# Patient Record
Sex: Female | Born: 1950
Health system: Southern US, Community
[De-identification: ages and names within clinical notes are randomized; demographics above are authoritative.]

## PROBLEM LIST (undated history)

## (undated) DIAGNOSIS — G4733 Obstructive sleep apnea (adult) (pediatric): Secondary | ICD-10-CM

## (undated) DIAGNOSIS — J449 Chronic obstructive pulmonary disease, unspecified: Secondary | ICD-10-CM

## (undated) HISTORY — PX: ABDOMINAL HYSTERECTOMY: SHX81

## (undated) HISTORY — DX: Chronic obstructive pulmonary disease, unspecified: J44.9

## (undated) HISTORY — PX: BREAST REDUCTION SURGERY: SHX8

## (undated) HISTORY — DX: Obstructive sleep apnea (adult) (pediatric): G47.33

## (undated) HISTORY — PX: REDUCTION MAMMAPLASTY: SUR839

## (undated) HISTORY — PX: BREAST BIOPSY: SHX20

## (undated) HISTORY — PX: OTHER SURGICAL HISTORY: SHX169

---

## 2018-12-22 DIAGNOSIS — I10 Essential (primary) hypertension: Secondary | ICD-10-CM | POA: Insufficient documentation

## 2018-12-22 DIAGNOSIS — I2781 Cor pulmonale (chronic): Secondary | ICD-10-CM | POA: Insufficient documentation

## 2019-02-25 ENCOUNTER — Telehealth: Payer: Self-pay | Admitting: Internal Medicine

## 2019-02-25 NOTE — Telephone Encounter (Signed)
Spoke with patient. She stated that she has an appt with Dr. Maple Hudson tomorrow at 130 for OSA. She had a sleep study done back in [place] and wanted CY to have a copy of it before her exam. She stated that the request needed to come directly from [place].   Advised her that I would send a fax letterhead to [facility] to request the sleep study. She verbalized understanding. Fax has been sent. Nothing further needed at time of call.

## 2019-02-26 ENCOUNTER — Encounter: Payer: Self-pay | Admitting: Internal Medicine

## 2019-02-26 ENCOUNTER — Ambulatory Visit (INDEPENDENT_AMBULATORY_CARE_PROVIDER_SITE_OTHER): Payer: Medicare Other | Admitting: Internal Medicine

## 2019-02-26 VITALS — BP 100/50 | HR 69 | Temp 98.0°F | Ht 60.5 in | Wt 176.8 lb

## 2019-02-26 DIAGNOSIS — J449 Chronic obstructive pulmonary disease, unspecified: Secondary | ICD-10-CM | POA: Diagnosis not present

## 2019-02-26 DIAGNOSIS — G4733 Obstructive sleep apnea (adult) (pediatric): Secondary | ICD-10-CM

## 2019-02-26 NOTE — Assessment & Plan Note (Signed)
We have no documentation. She is now stable and plans to get meds refilled with her new PCP - appointment is pending with no acute needs. Stopped smoking around 1997.

## 2019-02-26 NOTE — Patient Instructions (Signed)
Order- DME [facility]- please service or replace current machine- making disturbing noise. Auto 5-15, mask of choice, humidifier, supplies, AirView  Please call if we can help

## 2019-02-26 NOTE — Progress Notes (Addendum)
02/26/2019-68 year old female former smoker for sleep evaluation. Widowed, lives alone, not working. NPSG 2018- [place]/ [facility] 06/26/17- AHI 108.1/ hr, desaturation to 82%, BMI 40.8 CPAP / [facility] ----self referred; sleep study done 2018 for OSA; on CPAP w/ [facility] Body weight today 176 lbs She has been using CPAP consistently for several years with improved sleep and has had several sleep studies in [place].  She thinks records were being sent to [facility] care, but the local office has no record of her at this time.  Current machine is about 34-1/67 years old, and was through [employer], [employer]/film[employer]video in [place]. Starting a few weeks before she moved, the machine began making a disturbing rattling noise which starts about an hour after she turns it on.  This happens repeatedly through the night and is interfering with sleep so that she turns the machine off.  She says she took it to [facility] and was told the machine would need to be replaced.  She does not know her pressure settings. She has a dx oc COPD, managed medically w/o O2. This is stable and she will have it managed for now through new local PCP with appointment pending.  Denies ENT surgery or heart disease. Mild occasional GERD symptoms. Has been dieting with weight loss over the past year.  Prior to Admission medications   Medication Sig Start Date End Date Taking? Authorizing Provider  aspirin EC 81 MG tablet Take 81 mg by mouth daily.   Yes [provider]  bumetanide (BUMEX) 1 MG tablet Take 1 mg by mouth daily.   Yes [provider]  cholecalciferol (VITAMIN D3) 25 MCG (1000 UT) tablet Take 1,000 Units by mouth daily. Pt states she takes 5,000 mg daily   Yes [provider]  rOPINIRole (REQUIP) 1 MG tablet Take 1 mg by mouth daily.   Yes [provider]   Past Medical History:  Diagnosis Date  . COPD (chronic obstructive pulmonary disease) (HCC)   . OSA (obstructive sleep apnea)    Past Surgical History:   Procedure Laterality Date  . ABDOMINAL HYSTERECTOMY    . BREAST REDUCTION SURGERY    . right knee replacement     Family History  Problem Relation Age of Onset  . Emphysema Mother   . Heart attack Mother   . Heart attack Father   . Multiple sclerosis Father    Social History   Socioeconomic History  . Marital status: Unknown    Spouse name: Not on file  . Number of children: Not on file  . Years of education: Not on file  . Highest education level: Not on file  Occupational History  . Not on file  Social Needs  . Financial resource strain: Not on file  . Food insecurity:    Worry: Not on file    Inability: Not on file  . Transportation needs:    Medical: Not on file    Non-medical: Not on file  Tobacco Use  . Smoking status: Former Smoker    Packs/day: 0.50    Types: Cigarettes    Last attempt to quit: 12/04/1995    Years since quitting: 23.2  . Smokeless tobacco: Never Used  Substance and Sexual Activity  . Alcohol use: Yes    Comment: couple drinks/month  . Drug use: Never  . Sexual activity: Not on file  Lifestyle  . Physical activity:    Days per week: Not on file    Minutes per session: Not on file  . Stress:  Not on file  Relationships  . Social connections:    Talks on phone: Not on file    Gets together: Not on file    Attends religious service: Not on file    Active member of club or organization: Not on file    Attends meetings of clubs or organizations: Not on file    Relationship status: Not on file  . Intimate partner violence:    Fear of current or ex partner: Not on file    Emotionally abused: Not on file    Physically abused: Not on file    Forced sexual activity: Not on file  Other Topics Concern  . Not on file  Social History Narrative  . Not on file   ROS-see HPI   + = positive Constitutional:    +weight loss, night sweats, fevers, chills, fatigue, lassitude. HEENT:    headaches, difficulty swallowing, tooth/dental problems, sore  throat,      + sneezing, itching, ear ache, nasal congestion, post nasal drip, snoring CV:    chest pain, orthopnea, PND, swelling in lower extremities, anasarca,                                  dizziness, palpitations Resp:   +shortness of breath with exertion or at rest.                productive cough,   non-productive cough, coughing up of blood.              change in color of mucus.  wheezing.   Skin:    rash or lesions. GI:  No-   heartburn, indigestion, abdominal pain, nausea, vomiting, diarrhea,                 change in bowel habits, loss of appetite GU: dysuria, change in color of urine, no urgency or frequency.   flank pain. MS:   +joint pain, stiffness, decreased range of motion, back pain. Neuro-     nothing unusual Psych:  change in mood or affect.  depression or anxiety.   memory loss.  OBJ- Physical Exam General- Alert, Oriented, Affect-appropriate, Distress- none acute Skin- rash-none, lesions- none, excoriation- none Lymphadenopathy- none Head- atraumatic            Eyes- Gross vision intact, PERRLA, conjunctivae and secretions clear            Ears- Hearing, canals-normal            Nose- Clear, no-Septal dev, mucus, polyps, erosion, perforation             Throat- Mallampati II , mucosa clear , drainage- none, tonsils- atrophic Neck- flexible , trachea midline, no stridor , thyroid nl, carotid no bruit Chest - symmetrical excursion , unlabored           Heart/CV- RRR , no murmur , no gallop  , no rub, nl s1 s2                           - JVD- none , edema- none, stasis changes- none, varices- none           Lung- clear to P&A, wheeze- none, cough- none , dullness-none, rub- none           Chest wall-  Abd-  Br/ Gen/ Rectal- Not done, not indicated Extrem- cyanosis- none, clubbing, none,  atrophy- none, strength- nl Neuro- grossly intact to observation

## 2019-02-26 NOTE — Assessment & Plan Note (Addendum)
We are requesting supporting documentation from her previous provider in [place] and [address]. She has had several sleep studies. She describes improved quality of sleep and good compliance with CPAP until machine malfunctioned.  If her machine really needs to be replaced, we will ask for auto 5-15 and download. That may require input from local [facility], if her previous communication was with a [place] branch

## 2019-03-02 ENCOUNTER — Telehealth: Payer: Self-pay | Admitting: Internal Medicine

## 2019-03-02 NOTE — Telephone Encounter (Signed)
Call returned to patient, patient wanted to know the status of her records request from her other office. Made aware records have not been received yet. Made patient aware I would give at least a week. Voiced understanding. She would like a call back once we have received these. Will route message to Irving Burton to hold until we receive sleep study. Will update chart once received.

## 2019-03-03 NOTE — Telephone Encounter (Signed)
This was previously routed to [place].

## 2019-03-04 NOTE — Telephone Encounter (Signed)
Check CY's box this morning and saw patient's sleep study from previous pulmonologist had been faxed over. Called patient to inform her of this. Let her know that once CY reviews study we will go from there as far as treatment is concerned. Also let patient know to give [place] a call should she have any questions or concerns. Nothing further needed at this time.

## 2019-03-19 ENCOUNTER — Ambulatory Visit: Payer: Self-pay | Admitting: Family Medicine

## 2019-03-26 ENCOUNTER — Encounter: Payer: Self-pay | Admitting: Family Medicine

## 2019-04-17 DIAGNOSIS — J449 Chronic obstructive pulmonary disease, unspecified: Secondary | ICD-10-CM | POA: Diagnosis not present

## 2019-05-18 DIAGNOSIS — H2513 Age-related nuclear cataract, bilateral: Secondary | ICD-10-CM | POA: Diagnosis not present

## 2019-05-18 DIAGNOSIS — H21233 Degeneration of iris (pigmentary), bilateral: Secondary | ICD-10-CM | POA: Diagnosis not present

## 2019-05-18 DIAGNOSIS — J449 Chronic obstructive pulmonary disease, unspecified: Secondary | ICD-10-CM | POA: Diagnosis not present

## 2019-05-18 DIAGNOSIS — H40013 Open angle with borderline findings, low risk, bilateral: Secondary | ICD-10-CM | POA: Diagnosis not present

## 2019-05-18 DIAGNOSIS — H25013 Cortical age-related cataract, bilateral: Secondary | ICD-10-CM | POA: Diagnosis not present

## 2019-05-21 DIAGNOSIS — M5137 Other intervertebral disc degeneration, lumbosacral region: Secondary | ICD-10-CM | POA: Diagnosis not present

## 2019-05-21 DIAGNOSIS — M9903 Segmental and somatic dysfunction of lumbar region: Secondary | ICD-10-CM | POA: Diagnosis not present

## 2019-05-21 DIAGNOSIS — M25551 Pain in right hip: Secondary | ICD-10-CM | POA: Diagnosis not present

## 2019-05-21 DIAGNOSIS — M9905 Segmental and somatic dysfunction of pelvic region: Secondary | ICD-10-CM | POA: Diagnosis not present

## 2019-05-25 DIAGNOSIS — M25551 Pain in right hip: Secondary | ICD-10-CM | POA: Diagnosis not present

## 2019-05-25 DIAGNOSIS — M9903 Segmental and somatic dysfunction of lumbar region: Secondary | ICD-10-CM | POA: Diagnosis not present

## 2019-05-25 DIAGNOSIS — M5136 Other intervertebral disc degeneration, lumbar region: Secondary | ICD-10-CM | POA: Diagnosis not present

## 2019-05-25 DIAGNOSIS — M5137 Other intervertebral disc degeneration, lumbosacral region: Secondary | ICD-10-CM | POA: Diagnosis not present

## 2019-05-25 DIAGNOSIS — M9905 Segmental and somatic dysfunction of pelvic region: Secondary | ICD-10-CM | POA: Diagnosis not present

## 2019-05-26 DIAGNOSIS — M25551 Pain in right hip: Secondary | ICD-10-CM | POA: Diagnosis not present

## 2019-05-26 DIAGNOSIS — M5137 Other intervertebral disc degeneration, lumbosacral region: Secondary | ICD-10-CM | POA: Diagnosis not present

## 2019-05-26 DIAGNOSIS — M9905 Segmental and somatic dysfunction of pelvic region: Secondary | ICD-10-CM | POA: Diagnosis not present

## 2019-05-26 DIAGNOSIS — M9903 Segmental and somatic dysfunction of lumbar region: Secondary | ICD-10-CM | POA: Diagnosis not present

## 2019-05-28 DIAGNOSIS — M5137 Other intervertebral disc degeneration, lumbosacral region: Secondary | ICD-10-CM | POA: Diagnosis not present

## 2019-05-28 DIAGNOSIS — M9905 Segmental and somatic dysfunction of pelvic region: Secondary | ICD-10-CM | POA: Diagnosis not present

## 2019-05-28 DIAGNOSIS — M9903 Segmental and somatic dysfunction of lumbar region: Secondary | ICD-10-CM | POA: Diagnosis not present

## 2019-05-28 DIAGNOSIS — M25551 Pain in right hip: Secondary | ICD-10-CM | POA: Diagnosis not present

## 2019-05-29 ENCOUNTER — Encounter: Payer: Self-pay | Admitting: Internal Medicine

## 2019-05-29 ENCOUNTER — Ambulatory Visit (INDEPENDENT_AMBULATORY_CARE_PROVIDER_SITE_OTHER): Payer: Medicare Other

## 2019-05-29 ENCOUNTER — Ambulatory Visit: Payer: Medicare Other | Admitting: Internal Medicine

## 2019-05-29 ENCOUNTER — Other Ambulatory Visit: Payer: Self-pay

## 2019-05-29 VITALS — BP 118/80 | HR 74 | Temp 98.5°F | Ht 60.0 in | Wt 183.8 lb

## 2019-05-29 DIAGNOSIS — J449 Chronic obstructive pulmonary disease, unspecified: Secondary | ICD-10-CM

## 2019-05-29 DIAGNOSIS — J811 Chronic pulmonary edema: Secondary | ICD-10-CM | POA: Diagnosis not present

## 2019-05-29 DIAGNOSIS — G4733 Obstructive sleep apnea (adult) (pediatric): Secondary | ICD-10-CM | POA: Diagnosis not present

## 2019-05-29 MED ORDER — ALBUTEROL SULFATE HFA 108 (90 BASE) MCG/ACT IN AERS: 2.0000 | INHALATION_SPRAY | Freq: Four times a day (QID) | RESPIRATORY_TRACT | 12 refills | Status: DC | PRN

## 2019-05-29 NOTE — Progress Notes (Signed)
HPI  female former smoker followed for OSA, complicated by COPD. Widowed, lives alone, not working. NPSG 2018- [place]/ [facility] 06/26/17- AHI 108.1/ hr, desaturation to 82%, BMI 40.8  -------------------------------------------------------------------------- 02/26/2019-68 year old female former smoker for sleep evaluation. Widowed, lives alone, not working. NPSG 2018- [place]/ [facility] 06/26/17- AHI 108.1/ hr, desaturation to 82%, BMI 40.8 CPAP / [facility] ----self referred; sleep study done 2018 for OSA; on CPAP w/ [facility] Body weight today 176 lbs She has been using CPAP consistently for several years with improved sleep and has had several sleep studies in [place].  She [facility], but the local office has no record of her at this time.  Current machine is about 703-1/68 years old, and was through [employer] [employer] [place]. Starting a few weeks before she moved, the machine began making a disturbing rattling noise which starts about an hour after she turns it on.  This happens repeatedly through the night and is interfering with sleep so that she turns the machine off.  She says she took it to [facility] and was told the machine would need to be replaced.  She does not know her pressure settings. She has a dx oc COPD, managed medically w/o O2. This is stable and she will have it managed for now through new local PCP with appointment pending.  Denies ENT surgery or heart disease. Mild occasional GERD symptoms. Has been dieting with weight loss over the past year.  05/29/2019- 68 year old female former smoker  Widowed, lives alone, not working.Followed for OSA, complicated by COPD -----OSA on CPAP auto 5-15, DME: [facility]; pt reports not using CPAP d/t increased pressure setting No download- no card Auto 5-15/ [facility] Body weight today 183 lbs She got a refurbished machine. Ordered for autoset, but she describes it as a steady very hard pressure that is intolerable. We  discussed her hx of dx COPD and will get some data to assess. Not having cough and rare wheeze.  ROS-see HPI   + = positive Constitutional:    +weight loss, night sweats, fevers, chills, fatigue, lassitude. HEENT:    headaches, difficulty swallowing, tooth/dental problems, sore throat,      + sneezing, itching, ear ache, nasal congestion, post nasal drip, snoring CV:    chest pain, orthopnea, PND, swelling in lower extremities, anasarca,                                  dizziness, palpitations Resp:   +shortness of breath with exertion or at rest.                productive cough,   non-productive cough, coughing up of blood.              change in color of mucus.  wheezing.   Skin:    rash or lesions. GI:  No-   heartburn, indigestion, abdominal pain, nausea, vomiting, diarrhea,                 change in bowel habits, loss of appetite GU: dysuria, change in color of urine, no urgency or frequency.   flank pain. MS:   +joint pain, stiffness, decreased range of motion, back pain. Neuro-     nothing unusual Psych:  change in mood or affect.  depression or anxiety.   memory loss.  OBJ- Physical Exam General- Alert, Oriented, Affect-appropriate, Distress- none acute,  + overweight Skin- rash-none, lesions- none,  excoriation- none Lymphadenopathy- none Head- atraumatic            Eyes- Gross vision intact, PERRLA, conjunctivae and secretions clear            Ears- Hearing, canals-normal            Nose- Clear, no-Septal dev, mucus, polyps, erosion, perforation             Throat- Mallampati II , mucosa clear , drainage- none, tonsils- atrophic Neck- flexible , trachea midline, no stridor , thyroid nl, carotid no bruit Chest - symmetrical excursion , unlabored           Heart/CV- RRR , no murmur , no gallop  , no rub, nl s1 s2                           - JVD- none , edema- none, stasis changes- none, varices- none           Lung- clear to P&A, wheeze- none, cough- none , dullness-none, rub-  none           Chest wall-  Abd-  Br/ Gen/ Rectal- Not done, not indicated Extrem- cyanosis- none, clubbing, none, atrophy- none, strength- nl Neuro- grossly intact to observation

## 2019-05-29 NOTE — Assessment & Plan Note (Signed)
It does not sound as if her machine is self-adjusting as ordered, and it has no chip as ordered. Plan- DME to service check machine and ensure auto 5-15 is working.

## 2019-05-29 NOTE — Assessment & Plan Note (Signed)
Need to establish baseline for historical dx- former smoker. Plan- CXR, PFT

## 2019-05-29 NOTE — Patient Instructions (Signed)
Order- DME [facility]- please service machine to make sure auto 5-15 is working. Patient reports pressure stays too high.  Continue mask of choice, humidifier, supplies. Please insert download card  Order- CXR- dx COPD mixed type  Order- schedule PFT    Dx COPD mixed type

## 2019-06-01 ENCOUNTER — Telehealth: Payer: Self-pay | Admitting: Internal Medicine

## 2019-06-01 DIAGNOSIS — M5137 Other intervertebral disc degeneration, lumbosacral region: Secondary | ICD-10-CM | POA: Diagnosis not present

## 2019-06-01 DIAGNOSIS — M9903 Segmental and somatic dysfunction of lumbar region: Secondary | ICD-10-CM | POA: Diagnosis not present

## 2019-06-01 DIAGNOSIS — M25551 Pain in right hip: Secondary | ICD-10-CM | POA: Diagnosis not present

## 2019-06-01 DIAGNOSIS — M9905 Segmental and somatic dysfunction of pelvic region: Secondary | ICD-10-CM | POA: Diagnosis not present

## 2019-06-01 NOTE — Telephone Encounter (Signed)
Called pt with results from CXR 05/29/2019. Pt verbalized understanding and inquired on her recently ordered PFT. Pt originally stated she did not want to go through this process d/t having to get tested for COVID-19, however, pt states today that she would be willing to go through with it at this point. I let pt know that I would be routing a message to the schedulers responsible for this. Pt expressed understanding.  PFT was ordered on 05/29/2019 per CY's recommendations. Pt's next appt is 10/01/2019 at 10:30 AM w/ CY.   Routing to [place] per PFT scheduling protocol. Nothing further needed at this time.

## 2019-06-02 DIAGNOSIS — M9905 Segmental and somatic dysfunction of pelvic region: Secondary | ICD-10-CM | POA: Diagnosis not present

## 2019-06-02 DIAGNOSIS — M5137 Other intervertebral disc degeneration, lumbosacral region: Secondary | ICD-10-CM | POA: Diagnosis not present

## 2019-06-02 DIAGNOSIS — M9903 Segmental and somatic dysfunction of lumbar region: Secondary | ICD-10-CM | POA: Diagnosis not present

## 2019-06-02 DIAGNOSIS — M25551 Pain in right hip: Secondary | ICD-10-CM | POA: Diagnosis not present

## 2019-06-03 ENCOUNTER — Ambulatory Visit: Payer: Self-pay | Admitting: Family Medicine

## 2019-06-04 DIAGNOSIS — M9903 Segmental and somatic dysfunction of lumbar region: Secondary | ICD-10-CM | POA: Diagnosis not present

## 2019-06-04 DIAGNOSIS — M25551 Pain in right hip: Secondary | ICD-10-CM | POA: Diagnosis not present

## 2019-06-04 DIAGNOSIS — M5137 Other intervertebral disc degeneration, lumbosacral region: Secondary | ICD-10-CM | POA: Diagnosis not present

## 2019-06-04 DIAGNOSIS — M9905 Segmental and somatic dysfunction of pelvic region: Secondary | ICD-10-CM | POA: Diagnosis not present

## 2019-06-08 DIAGNOSIS — M25551 Pain in right hip: Secondary | ICD-10-CM | POA: Diagnosis not present

## 2019-06-08 DIAGNOSIS — M9903 Segmental and somatic dysfunction of lumbar region: Secondary | ICD-10-CM | POA: Diagnosis not present

## 2019-06-08 DIAGNOSIS — M5137 Other intervertebral disc degeneration, lumbosacral region: Secondary | ICD-10-CM | POA: Diagnosis not present

## 2019-06-08 DIAGNOSIS — M9905 Segmental and somatic dysfunction of pelvic region: Secondary | ICD-10-CM | POA: Diagnosis not present

## 2019-06-09 DIAGNOSIS — M9903 Segmental and somatic dysfunction of lumbar region: Secondary | ICD-10-CM | POA: Diagnosis not present

## 2019-06-09 DIAGNOSIS — M9905 Segmental and somatic dysfunction of pelvic region: Secondary | ICD-10-CM | POA: Diagnosis not present

## 2019-06-09 DIAGNOSIS — M25551 Pain in right hip: Secondary | ICD-10-CM | POA: Diagnosis not present

## 2019-06-09 DIAGNOSIS — M5137 Other intervertebral disc degeneration, lumbosacral region: Secondary | ICD-10-CM | POA: Diagnosis not present

## 2019-06-10 ENCOUNTER — Ambulatory Visit: Payer: Self-pay | Admitting: Family Medicine

## 2019-06-10 DIAGNOSIS — G4733 Obstructive sleep apnea (adult) (pediatric): Secondary | ICD-10-CM | POA: Diagnosis not present

## 2019-06-10 DIAGNOSIS — J449 Chronic obstructive pulmonary disease, unspecified: Secondary | ICD-10-CM | POA: Diagnosis not present

## 2019-06-11 DIAGNOSIS — M9903 Segmental and somatic dysfunction of lumbar region: Secondary | ICD-10-CM | POA: Diagnosis not present

## 2019-06-11 DIAGNOSIS — M5137 Other intervertebral disc degeneration, lumbosacral region: Secondary | ICD-10-CM | POA: Diagnosis not present

## 2019-06-11 DIAGNOSIS — M9905 Segmental and somatic dysfunction of pelvic region: Secondary | ICD-10-CM | POA: Diagnosis not present

## 2019-06-11 DIAGNOSIS — M25551 Pain in right hip: Secondary | ICD-10-CM | POA: Diagnosis not present

## 2019-06-12 ENCOUNTER — Other Ambulatory Visit: Payer: Self-pay

## 2019-06-12 ENCOUNTER — Ambulatory Visit (INDEPENDENT_AMBULATORY_CARE_PROVIDER_SITE_OTHER): Payer: Medicare Other

## 2019-06-12 ENCOUNTER — Ambulatory Visit (INDEPENDENT_AMBULATORY_CARE_PROVIDER_SITE_OTHER): Payer: Medicare Other | Admitting: Family Medicine

## 2019-06-12 ENCOUNTER — Encounter: Payer: Self-pay | Admitting: Family Medicine

## 2019-06-12 VITALS — BP 120/80 | HR 70 | Temp 98.2°F | Ht 60.0 in | Wt 185.4 lb

## 2019-06-12 DIAGNOSIS — M79641 Pain in right hand: Secondary | ICD-10-CM

## 2019-06-12 DIAGNOSIS — Z13820 Encounter for screening for osteoporosis: Secondary | ICD-10-CM

## 2019-06-12 DIAGNOSIS — Z1239 Encounter for other screening for malignant neoplasm of breast: Secondary | ICD-10-CM

## 2019-06-12 DIAGNOSIS — G2581 Restless legs syndrome: Secondary | ICD-10-CM | POA: Diagnosis not present

## 2019-06-12 DIAGNOSIS — Z0000 Encounter for general adult medical examination without abnormal findings: Secondary | ICD-10-CM

## 2019-06-12 NOTE — Progress Notes (Signed)
Peggy Dixon is a 68 y.o. female  Chief Complaint  Patient presents with   Establish Care    CPE/ Last CPE April 2019     HPI: Peggy EatonKathy Dixon is a 68 y.o. female here to establish care with our office and for her annual CPE. Last CPE 03/2018. She moved to [place] in 02/2019.   Last PAP: n/a - s/p TAH Last mammo: 03/2018 Last Dexa: years ago Last colonoscopy: 2019 - normal/negative - due in 2029  Med refills needed today? None  Specialist: pulmonary (Dr. Maple HudsonYoung)   Past Medical History:  Diagnosis Date   COPD (chronic obstructive pulmonary disease) (HCC)    OSA (obstructive sleep apnea)     Past Surgical History:  Procedure Laterality Date   ABDOMINAL HYSTERECTOMY     BREAST REDUCTION SURGERY     eye surgery right      right knee replacement      Social History   Socioeconomic History   Marital status: Unknown    Spouse name: Not on file   Number of children: Not on file   Years of education: Not on file   Highest education level: Not on file  Occupational History   Not on file  Social Needs   Financial resource strain: Not on file   Food insecurity    Worry: Not on file    Inability: Not on file   Transportation needs    Medical: Not on file    Non-medical: Not on file  Tobacco Use   Smoking status: Former Smoker    Packs/day: 0.50    Types: Cigarettes    Quit date: 12/04/1995    Years since quitting: 23.5   Smokeless tobacco: Never Used  Substance and Sexual Activity   Alcohol use: Yes    Comment: couple drinks/month   Drug use: Never   Sexual activity: Not on file  Lifestyle   Physical activity    Days per week: Not on file    Minutes per session: Not on file   Stress: Not on file  Relationships   Social connections    Talks on phone: Not on file    Gets together: Not on file    Attends religious service: Not on file    Active member of club or organization: Not on file    Attends meetings of clubs or organizations: Not on  file    Relationship status: Not on file   Intimate partner violence    Fear of current or ex partner: Not on file    Emotionally abused: Not on file    Physically abused: Not on file    Forced sexual activity: Not on file  Other Topics Concern   Not on file  Social History Narrative   Not on file    Family History  Problem Relation Age of Onset   Emphysema Mother    Heart attack Mother    Heart attack Father    Multiple sclerosis Father       There is no immunization history on file for this patient.  Outpatient Encounter Medications as of 06/12/2019  Medication Sig   albuterol (VENTOLIN HFA) 108 (90 Base) MCG/ACT inhaler Inhale 2 puffs into the lungs every 6 (six) hours as needed for wheezing or shortness of breath.   aspirin EC 81 MG tablet Take 81 mg by mouth daily.   bumetanide (BUMEX) 1 MG tablet Take 1 mg by mouth daily.   cholecalciferol (VITAMIN D3) 25 MCG (1000  UT) tablet Take 1,000 Units by mouth daily. Pt states she takes 5,000 mg daily   rOPINIRole (REQUIP) 1 MG tablet Take 1 mg by mouth daily.   No facility-administered encounter medications on file as of 06/12/2019.      ROS: Gen: no fever, chills  Skin: no rash, itching ENT: no ear pain, ear drainage, nasal congestion, rhinorrhea, sinus pressure, sore throat Eyes: no blurry vision, double vision Resp: no cough, wheeze,SOB Breast: no breast tenderness, no nipple discharge, no breast masses CV: no CP, palpitations, LE edema,  GI: no heartburn, n/v/d/c, abd pain GU: no dysuria, urgency, frequency, hematuria; no vaginal itching, odor, discharge MSK: no joint pain, myalgias, + back pain - sees chiropractor regularly  Neuro: no dizziness, headache, weakness, vertigo Psych: no depression, anxiety, insomnia   No Known Allergies  BP 120/80    Pulse 70    Temp 98.2 F (36.8 C) (Oral)    Ht 5' (1.524 m)    Wt 185 lb 6.4 oz (84.1 kg)    SpO2 96%    BMI 36.21 kg/m   Physical Exam  Constitutional:  She is oriented to person, place, and time. She appears well-developed and well-nourished. No distress.  HENT:  Head: Normocephalic and atraumatic.  Right Ear: Tympanic membrane and ear canal normal.  Left Ear: Tympanic membrane and ear canal normal.  Nose: Nose normal.  Mouth/Throat: Oropharynx is clear and moist and mucous membranes are normal.  Eyes: Pupils are equal, round, and reactive to light. Conjunctivae are normal.  Neck: Neck supple. No thyromegaly present.  Cardiovascular: Normal rate, regular rhythm, normal heart sounds and intact distal pulses.  No murmur heard. Pulmonary/Chest: Effort normal and breath sounds normal. No respiratory distress. She has no wheezes. She has no rhonchi.  Abdominal: Soft. Bowel sounds are normal. She exhibits no distension and no mass. There is no abdominal tenderness.  Musculoskeletal:        General: No edema.  Lymphadenopathy:    She has no cervical adenopathy.  Neurological: She is alert and oriented to person, place, and time. She exhibits normal muscle tone. Coordination normal.  Skin: Skin is warm and dry.  Psychiatric: She has a normal mood and affect. Her behavior is normal.     A/P:  1. Annual physical exam - due for mammo, dexa; UTD on colonoscopy - cont with regular walking as tolerated, work to improve diet (healthier food choices) - immunizations UTD - ALT; Future - AST; Future - Basic metabolic panel; Future - Lipid panel; Future - VITAMIN D 25 Hydroxy (Vit-D Deficiency, Fractures); Future - next CPE in 1 year  2. Screening for breast cancer - MM DIGITAL SCREENING BILATERAL; Future  3. Screening for osteoporosis - cont Vit D supplement - VITAMIN D 25 Hydroxy (Vit-D Deficiency, Fractures); Future - DG Bone Density; Future  4. Restless leg syndrome - cont qhs requip

## 2019-06-12 NOTE — Addendum Note (Signed)
Addended by: Lynnea Ferrier on: 06/12/2019 04:14 PM   Modules accepted: Orders

## 2019-06-12 NOTE — Patient Instructions (Signed)
Health Maintenance, Female Adopting a healthy lifestyle and getting preventive care are important in promoting health and wellness. Ask your health care provider about:  The right schedule for you to have regular tests and exams.  Things you can do on your own to prevent diseases and keep yourself healthy. What should I know about diet, weight, and exercise? Eat a healthy diet   Eat a diet that includes plenty of vegetables, fruits, low-fat dairy products, and lean protein.  Do not eat a lot of foods that are high in solid fats, added sugars, or sodium. Maintain a healthy weight Body mass index (BMI) is used to identify weight problems. It estimates body fat based on height and weight. Your health care provider can help determine your BMI and help you achieve or maintain a healthy weight. Get regular exercise Get regular exercise. This is one of the most important things you can do for your health. Most adults should:  Exercise for at least 150 minutes each week. The exercise should increase your heart rate and make you sweat (moderate-intensity exercise).  Do strengthening exercises at least twice a week. This is in addition to the moderate-intensity exercise.  Spend less time sitting. Even light physical activity can be beneficial. Watch cholesterol and blood lipids Have your blood tested for lipids and cholesterol at 67 years of age, then have this test every 5 years. Have your cholesterol levels checked more often if:  Your lipid or cholesterol levels are high.  You are older than 67 years of age.  You are at high risk for heart disease. What should I know about cancer screening? Depending on your health history and family history, you may need to have cancer screening at various ages. This may include screening for:  Breast cancer.  Cervical cancer.  Colorectal cancer.  Skin cancer.  Lung cancer. What should I know about heart disease, diabetes, and high blood  pressure? Blood pressure and heart disease  High blood pressure causes heart disease and increases the risk of stroke. This is more likely to develop in people who have high blood pressure readings, are of African descent, or are overweight.  Have your blood pressure checked: ? Every 3-5 years if you are 18-39 years of age. ? Every year if you are 40 years old or older. Diabetes Have regular diabetes screenings. This checks your fasting blood sugar level. Have the screening done:  Once every three years after age 40 if you are at a normal weight and have a low risk for diabetes.  More often and at a younger age if you are overweight or have a high risk for diabetes. What should I know about preventing infection? Hepatitis B If you have a higher risk for hepatitis B, you should be screened for this virus. Talk with your health care provider to find out if you are at risk for hepatitis B infection. Hepatitis C Testing is recommended for:  Everyone born from 1945 through 1965.  Anyone with known risk factors for hepatitis C. Sexually transmitted infections (STIs)  Get screened for STIs, including gonorrhea and chlamydia, if: ? You are sexually active and are younger than 67 years of age. ? You are older than 67 years of age and your health care provider tells you that you are at risk for this type of infection. ? Your sexual activity has changed since you were last screened, and you are at increased risk for chlamydia or gonorrhea. Ask your health care provider if   you are at risk.  Ask your health care provider about whether you are at high risk for HIV. Your health care provider may recommend a prescription medicine to help prevent HIV infection. If you choose to take medicine to prevent HIV, you should first get tested for HIV. You should then be tested every 3 months for as long as you are taking the medicine. Pregnancy  If you are about to stop having your period (premenopausal) and  you may become pregnant, seek counseling before you get pregnant.  Take 400 to 800 micrograms (mcg) of folic acid every day if you become pregnant.  Ask for birth control (contraception) if you want to prevent pregnancy. Osteoporosis and menopause Osteoporosis is a disease in which the bones lose minerals and strength with aging. This can result in bone fractures. If you are 65 years old or older, or if you are at risk for osteoporosis and fractures, ask your health care provider if you should:  Be screened for bone loss.  Take a calcium or vitamin D supplement to lower your risk of fractures.  Be given hormone replacement therapy (HRT) to treat symptoms of menopause. Follow these instructions at home: Lifestyle  Do not use any products that contain nicotine or tobacco, such as cigarettes, e-cigarettes, and chewing tobacco. If you need help quitting, ask your health care provider.  Do not use street drugs.  Do not share needles.  Ask your health care provider for help if you need support or information about quitting drugs. Alcohol use  Do not drink alcohol if: ? Your health care provider tells you not to drink. ? You are pregnant, may be pregnant, or are planning to become pregnant.  If you drink alcohol: ? Limit how much you use to 0-1 drink a day. ? Limit intake if you are breastfeeding.  Be aware of how much alcohol is in your drink. In the [place] one drink equals one 12 oz bottle of beer (355 mL), one 5 oz glass of wine (148 mL), or one 1 oz glass of hard liquor (44 mL). General instructions  Schedule regular health, dental, and eye exams.  Stay current with your vaccines.  Tell your health care provider if: ? You often feel depressed. ? You have ever been abused or do not feel safe at home. Summary  Adopting a healthy lifestyle and getting preventive care are important in promoting health and wellness.  Follow your health care provider's instructions about healthy  diet, exercising, and getting tested or screened for diseases.  Follow your health care provider's instructions on monitoring your cholesterol and blood pressure. This information is not intended to replace advice given to you by your health care provider. Make sure you discuss any questions you have with your health care provider. Document Released: 06/04/2011 Document Revised: 11/12/2018 Document Reviewed: 11/12/2018 Elsevier Patient Education  2020 Elsevier Inc.  

## 2019-06-15 DIAGNOSIS — M5137 Other intervertebral disc degeneration, lumbosacral region: Secondary | ICD-10-CM | POA: Diagnosis not present

## 2019-06-15 DIAGNOSIS — M9903 Segmental and somatic dysfunction of lumbar region: Secondary | ICD-10-CM | POA: Diagnosis not present

## 2019-06-15 DIAGNOSIS — M25551 Pain in right hip: Secondary | ICD-10-CM | POA: Diagnosis not present

## 2019-06-15 DIAGNOSIS — M9905 Segmental and somatic dysfunction of pelvic region: Secondary | ICD-10-CM | POA: Diagnosis not present

## 2019-06-16 ENCOUNTER — Telehealth: Payer: Self-pay

## 2019-06-16 NOTE — Telephone Encounter (Signed)
During this illness, did/does the patient experience any of the following symptoms? Fever >100.4F []  Yes [x]  No []  Unknown Subjective fever (felt feverish) []  Yes [x]  No []  Unknown Chills []  Yes [x]  No []  Unknown Muscle aches (myalgia) []  Yes [x]  No []  Unknown Runny nose (rhinorrhea) []  Yes [x]  No []  Unknown Sore throat []  Yes [x]  No []  Unknown Cough (new onset or worsening of chronic cough) []  Yes [x]  No []  Unknown Shortness of breath (dyspnea) []  Yes []  No []  Unknown Nausea or vomiting []  Yes [x]  No []  Unknown Headache []  Yes [x]  No []  Unknown Abdominal pain  []  Yes [x]  No []  Unknown Diarrhea (?3 loose/looser than normal stools/24hr period) []  Yes [x]  No []  Unknown Other, specify:  

## 2019-06-17 ENCOUNTER — Other Ambulatory Visit (INDEPENDENT_AMBULATORY_CARE_PROVIDER_SITE_OTHER): Payer: Medicare Other

## 2019-06-17 ENCOUNTER — Encounter: Payer: Self-pay | Admitting: Family Medicine

## 2019-06-17 DIAGNOSIS — Z13820 Encounter for screening for osteoporosis: Secondary | ICD-10-CM

## 2019-06-17 DIAGNOSIS — Z0000 Encounter for general adult medical examination without abnormal findings: Secondary | ICD-10-CM

## 2019-06-17 DIAGNOSIS — M79641 Pain in right hand: Secondary | ICD-10-CM

## 2019-06-17 DIAGNOSIS — J449 Chronic obstructive pulmonary disease, unspecified: Secondary | ICD-10-CM | POA: Diagnosis not present

## 2019-06-17 LAB — LIPID PANEL
Cholesterol: 199 mg/dL (ref 0–200)
HDL: 59.8 mg/dL (ref >39.00)
LDL Cholesterol: 117 mg/dL — ABNORMAL HIGH (ref 0–99)
NonHDL: 138.91
Total CHOL/HDL Ratio: 3
Triglycerides: 108 mg/dL (ref 0.0–149.0)
VLDL: 21.6 mg/dL (ref 0.0–40.0)

## 2019-06-17 LAB — BASIC METABOLIC PANEL
BUN: 28 mg/dL — ABNORMAL HIGH (ref 6–23)
CO2: 29 mEq/L (ref 19–32)
Calcium: 9.4 mg/dL (ref 8.4–10.5)
Chloride: 104 mEq/L (ref 96–112)
Creatinine, Ser: 0.61 mg/dL (ref 0.40–1.20)
GFR: 97.82 mL/min (ref >60.00)
Glucose, Bld: 94 mg/dL (ref 70–99)
Potassium: 4.3 mEq/L (ref 3.5–5.1)
Sodium: 141 mEq/L (ref 135–145)

## 2019-06-17 LAB — ALT: ALT: 19 U/L (ref 0–35)

## 2019-06-17 LAB — AST: AST: 15 U/L (ref 0–37)

## 2019-06-17 LAB — VITAMIN D 25 HYDROXY (VIT D DEFICIENCY, FRACTURES): VITD: 58.41 ng/mL (ref 30.00–100.00)

## 2019-06-18 DIAGNOSIS — M25551 Pain in right hip: Secondary | ICD-10-CM | POA: Diagnosis not present

## 2019-06-18 DIAGNOSIS — M5137 Other intervertebral disc degeneration, lumbosacral region: Secondary | ICD-10-CM | POA: Diagnosis not present

## 2019-06-18 DIAGNOSIS — M9905 Segmental and somatic dysfunction of pelvic region: Secondary | ICD-10-CM | POA: Diagnosis not present

## 2019-06-18 DIAGNOSIS — M9903 Segmental and somatic dysfunction of lumbar region: Secondary | ICD-10-CM | POA: Diagnosis not present

## 2019-06-22 DIAGNOSIS — M9903 Segmental and somatic dysfunction of lumbar region: Secondary | ICD-10-CM | POA: Diagnosis not present

## 2019-06-22 DIAGNOSIS — M25551 Pain in right hip: Secondary | ICD-10-CM | POA: Diagnosis not present

## 2019-06-22 DIAGNOSIS — M5137 Other intervertebral disc degeneration, lumbosacral region: Secondary | ICD-10-CM | POA: Diagnosis not present

## 2019-06-22 DIAGNOSIS — M9905 Segmental and somatic dysfunction of pelvic region: Secondary | ICD-10-CM | POA: Diagnosis not present

## 2019-06-23 ENCOUNTER — Other Ambulatory Visit: Payer: Self-pay

## 2019-06-23 NOTE — Progress Notes (Signed)
error 

## 2019-06-24 NOTE — Telephone Encounter (Signed)
Ok thanks Emily!  

## 2019-06-24 NOTE — Telephone Encounter (Signed)
Called patient to make her aware she is still on the list for the PFT. She states she is still freaking out and she is not sure if she wants to have the PFT. I made here aware that she will need to call us once she has made her mind up. She states she would like to have the blood test for Covid instead of the nose swab. She reports she does not think she can handle that. I made her aware I would get this message to [facility] for his recommendations.   Will route message to CY as in order to have PFT, pt will need covid testing.   CY please advise if patient can have blood test for Covid. She does not think she can handle the nose swab.

## 2019-06-24 NOTE — Telephone Encounter (Signed)
Called and spoke with pt letting her know the difference between the covid antibody test and the covid nasal swab which is required prior to the PFT. After fully explaining all to pt, pt has decided that she no longer wants to have PFT performed at this time.  Routing to CY as an [place].

## 2019-06-24 NOTE — Telephone Encounter (Signed)
The blood test for Covid antibodies tells [place] if she has been infected in the past, up to about 10 days ago. It doesn't tell us what we need before PFT testing, which is whether she is infected and contagious now.

## 2019-06-25 ENCOUNTER — Other Ambulatory Visit: Payer: Self-pay | Admitting: Family Medicine

## 2019-06-25 DIAGNOSIS — M9905 Segmental and somatic dysfunction of pelvic region: Secondary | ICD-10-CM | POA: Diagnosis not present

## 2019-06-25 DIAGNOSIS — M9903 Segmental and somatic dysfunction of lumbar region: Secondary | ICD-10-CM | POA: Diagnosis not present

## 2019-06-25 DIAGNOSIS — M25551 Pain in right hip: Secondary | ICD-10-CM | POA: Diagnosis not present

## 2019-06-25 DIAGNOSIS — M5137 Other intervertebral disc degeneration, lumbosacral region: Secondary | ICD-10-CM | POA: Diagnosis not present

## 2019-06-29 DIAGNOSIS — M5137 Other intervertebral disc degeneration, lumbosacral region: Secondary | ICD-10-CM | POA: Diagnosis not present

## 2019-06-29 DIAGNOSIS — M9905 Segmental and somatic dysfunction of pelvic region: Secondary | ICD-10-CM | POA: Diagnosis not present

## 2019-06-29 DIAGNOSIS — M25551 Pain in right hip: Secondary | ICD-10-CM | POA: Diagnosis not present

## 2019-06-29 DIAGNOSIS — M9903 Segmental and somatic dysfunction of lumbar region: Secondary | ICD-10-CM | POA: Diagnosis not present

## 2019-07-02 ENCOUNTER — Telehealth: Payer: Self-pay | Admitting: Internal Medicine

## 2019-07-02 NOTE — Telephone Encounter (Signed)
Pt is calling back [phone]

## 2019-07-02 NOTE — Telephone Encounter (Signed)
Dr. Annamaria Boots, please advise if you still want pt to keep scheduled appt with you in October since she is not wanting to have covid test performed or if you want her to follow up after she does have the PFT?

## 2019-07-02 NOTE — Telephone Encounter (Signed)
I can still see her as planned for f/u. We can work with her to see where we can help.

## 2019-07-02 NOTE — Telephone Encounter (Signed)
Spoke with the pt and notified of recs per CDY  She verbalized understanding  Nothing further needed 

## 2019-07-02 NOTE — Telephone Encounter (Signed)
Left message for patient to call back  

## 2019-07-18 DIAGNOSIS — J449 Chronic obstructive pulmonary disease, unspecified: Secondary | ICD-10-CM | POA: Diagnosis not present

## 2019-07-27 DIAGNOSIS — M25541 Pain in joints of right hand: Secondary | ICD-10-CM | POA: Diagnosis not present

## 2019-07-27 DIAGNOSIS — M13841 Other specified arthritis, right hand: Secondary | ICD-10-CM | POA: Diagnosis not present

## 2019-07-27 DIAGNOSIS — M79641 Pain in right hand: Secondary | ICD-10-CM | POA: Diagnosis not present

## 2019-08-18 DIAGNOSIS — J449 Chronic obstructive pulmonary disease, unspecified: Secondary | ICD-10-CM | POA: Diagnosis not present

## 2019-08-27 ENCOUNTER — Other Ambulatory Visit: Payer: Self-pay | Admitting: Family Medicine

## 2019-08-28 NOTE — Telephone Encounter (Signed)
Dr. Loletha Grayer okay to send in?

## 2019-09-04 ENCOUNTER — Other Ambulatory Visit: Payer: Self-pay

## 2019-09-04 ENCOUNTER — Ambulatory Visit
Admission: RE | Admit: 2019-09-04 | Discharge: 2019-09-04 | Disposition: A | Discharge status: Home / Self Care | Payer: Medicare Other | Source: Ambulatory Visit | Attending: Family Medicine | Admitting: Family Medicine

## 2019-09-04 DIAGNOSIS — Z13820 Encounter for screening for osteoporosis: Secondary | ICD-10-CM

## 2019-09-04 DIAGNOSIS — M85851 Other specified disorders of bone density and structure, right thigh: Secondary | ICD-10-CM | POA: Diagnosis not present

## 2019-09-04 DIAGNOSIS — Z780 Asymptomatic menopausal state: Secondary | ICD-10-CM | POA: Diagnosis not present

## 2019-09-16 ENCOUNTER — Encounter: Payer: Self-pay | Admitting: Family Medicine

## 2019-09-17 DIAGNOSIS — J449 Chronic obstructive pulmonary disease, unspecified: Secondary | ICD-10-CM | POA: Diagnosis not present

## 2019-09-22 ENCOUNTER — Other Ambulatory Visit: Payer: Self-pay

## 2019-09-22 ENCOUNTER — Ambulatory Visit (INDEPENDENT_AMBULATORY_CARE_PROVIDER_SITE_OTHER): Payer: Medicare Other

## 2019-09-22 DIAGNOSIS — Z23 Encounter for immunization: Secondary | ICD-10-CM

## 2019-10-01 ENCOUNTER — Ambulatory Visit: Payer: Medicare Other | Admitting: Internal Medicine

## 2019-10-18 DIAGNOSIS — J449 Chronic obstructive pulmonary disease, unspecified: Secondary | ICD-10-CM | POA: Diagnosis not present

## 2019-11-17 ENCOUNTER — Encounter: Payer: Self-pay | Admitting: Family Medicine

## 2019-11-17 DIAGNOSIS — J449 Chronic obstructive pulmonary disease, unspecified: Secondary | ICD-10-CM | POA: Diagnosis not present

## 2019-11-19 ENCOUNTER — Telehealth (INDEPENDENT_AMBULATORY_CARE_PROVIDER_SITE_OTHER): Payer: Medicare Other | Admitting: Family Medicine

## 2019-11-19 ENCOUNTER — Encounter: Payer: Self-pay | Admitting: Family Medicine

## 2019-11-19 VITALS — Ht 60.0 in | Wt 189.0 lb

## 2019-11-19 DIAGNOSIS — F419 Anxiety disorder, unspecified: Secondary | ICD-10-CM | POA: Diagnosis not present

## 2019-11-19 DIAGNOSIS — F322 Major depressive disorder, single episode, severe without psychotic features: Secondary | ICD-10-CM

## 2019-11-19 MED ORDER — FLUOXETINE HCL 20 MG PO TABS: ORAL_TABLET | ORAL | 3 refills | Status: DC

## 2019-11-19 NOTE — Patient Instructions (Signed)
Behavioral Health/Counseling Resources:  [employer] [facility]: [email or web address]  [email or web address]  [email or web address]  [facility] [email or web address]  [email or web address]

## 2019-11-19 NOTE — Progress Notes (Signed)
Virtual Visit via Video Note  I connected with Peggy Dixon on 11/19/19 at  3:00 PM EST by a video enabled telemedicine application and verified that I am speaking with the correct person using two identifiers. Location patient: home Location provider: work or home office Persons participating in the virtual visit: patient, provider  I discussed the limitations of evaluation and management by telemedicine and the availability of in person appointments. The patient expressed understanding and agreed to proceed.  Chief Complaint  Patient presents with  . Anxiety    pt wants to discuss anxiety and depression. Pt is concern of a rash maybe not sure on her bottom.     HPI: Peggy Dixon is a 68 y.o. female who complains of h/o anxiety and depression on/off x years. She was on zoloft x years which was helpful in treating her depression but she did not feel it was the perfect fit. She has been off meds x 8-9 years.  More recently she complains of irritability, feeling on edge, constant worrying, trouble concentrating, feeling more emotional and crying more and at times for no reason. She is eating more and not sleeping well. She doesn't get dressed some days because she has nowhere to go.  She is seeing Effie Shy (?sp) in [place] for [facility] counseling.  Depression screen PHQ 2/9 11/19/2019  Decreased Interest 3  Down, Depressed, Hopeless 3  PHQ - 2 Score 6  Altered sleeping 3  Tired, decreased energy 3  Change in appetite 3  Feeling bad or failure about yourself  3  Trouble concentrating 3  Moving slowly or fidgety/restless 0  Suicidal thoughts 0  PHQ-9 Score 21  Difficult doing work/chores Very difficult    GAD 7 : Generalized Anxiety Score 11/19/2019  Nervous, Anxious, on Edge 3  Control/stop worrying 3  Worry too much - different things 3  Trouble relaxing 3  Restless 3  Easily annoyed or irritable 3  Afraid - awful might happen 3  Total GAD 7 Score 21  Anxiety Difficulty  Very difficult    Past Medical History:  Diagnosis Date  . COPD (chronic obstructive pulmonary disease) ([place])   . OSA (obstructive sleep apnea)     Past Surgical History:  Procedure Laterality Date  . ABDOMINAL HYSTERECTOMY    . BREAST REDUCTION SURGERY    . eye surgery right     . right knee replacement      Family History  Problem Relation Age of Onset  . Emphysema Mother   . Heart attack Mother   . Heart attack Father   . Multiple sclerosis Father     Social History   Tobacco Use  . Smoking status: Former Smoker    Packs/day: 0.50    Types: Cigarettes    Quit date: 12/04/1995    Years since quitting: 23.9  . Smokeless tobacco: Never Used  Substance Use Topics  . Alcohol use: Yes    Comment: couple drinks/month  . Drug use: Never     Current Outpatient Medications:  .  albuterol (VENTOLIN HFA) 108 (90 Base) MCG/ACT inhaler, Inhale 2 puffs into the lungs every 6 (six) hours as needed for wheezing or shortness of breath., Disp: 18 g, Rfl: 12 .  aspirin EC 81 MG tablet, Take 81 mg by mouth daily., Disp: , Rfl:  .  bumetanide (BUMEX) 1 MG tablet, Take 1 mg by mouth daily., Disp: , Rfl:  .  cholecalciferol (VITAMIN D3) 25 MCG (1000 UT) tablet, Take 1,000 Units  by mouth daily. Pt states she takes 5,000 mg daily, Disp: , Rfl:  .  rOPINIRole (REQUIP) 1 MG tablet, TAKE 1 TABLET BY MOUTH DAILY, Disp: 90 tablet, Rfl: 3  No Known Allergies    ROS: See pertinent positives and negatives per HPI.   EXAM:  VITALS per patient if applicable: Ht 5' (1.524 m)   Wt 189 lb (85.7 kg) Comment: per pt.  BMI 36.91 kg/m    GENERAL: alert, oriented, appears well and in no acute distress  HEENT: atraumatic, conjunctiva clear, no obvious abnormalities on inspection of external nose and ears  NECK: normal movements of the head and neck  LUNGS: on inspection no signs of respiratory distress, breathing rate appears normal, no obvious gross SOB, gasping or wheezing, no  conversational dyspnea  CV: no obvious cyanosis  PSYCH/NEURO: pleasant and cooperative, speech and thought processing grossly intact   ASSESSMENT AND PLAN:  1. Anxiety 2. Depression, major, single episode, severe (HCC) - PHQ-9 = 21, GAD-7 = 21 Rx: - FLUoxetine (PROZAC) 20 MG tablet; 1/2 tab po daily x 1 week then 1 tab po daily  Dispense: 90 tablet; Refill: 3 - cont with [facility] counseling - work on [employer], regular exercise - f/u in 2-3 wks or sooner PRN Discussed plan and reviewed medications with patient, including risks, benefits, and potential side effects. Pt expressed understand. All questions answered.    I discussed the assessment and treatment plan with the patient. The patient was provided an opportunity to ask questions and all were answered. The patient agreed with the plan and demonstrated an understanding of the instructions.   The patient was advised to call back or seek an in-person evaluation if the symptoms worsen or if the condition fails to improve as anticipated.   [name], DO

## 2019-11-20 ENCOUNTER — Encounter: Payer: Self-pay | Admitting: Family Medicine

## 2019-11-23 ENCOUNTER — Ambulatory Visit
Admission: RE | Admit: 2019-11-23 | Discharge: 2019-11-23 | Disposition: A | Discharge status: Home / Self Care | Payer: Medicare Other | Source: Ambulatory Visit | Attending: Family Medicine | Admitting: Family Medicine

## 2019-11-23 ENCOUNTER — Other Ambulatory Visit: Payer: Self-pay

## 2019-11-23 DIAGNOSIS — Z1239 Encounter for other screening for malignant neoplasm of breast: Secondary | ICD-10-CM

## 2019-11-23 DIAGNOSIS — Z1231 Encounter for screening mammogram for malignant neoplasm of breast: Secondary | ICD-10-CM | POA: Diagnosis not present

## 2019-11-25 ENCOUNTER — Telehealth: Payer: Self-pay

## 2019-11-25 NOTE — Telephone Encounter (Signed)
PA started for Fluoxetine 20 mg, waiting for results    Climmie Cronce Key: [id number] - PA Case ID: [id number]

## 2019-11-26 NOTE — Telephone Encounter (Signed)
Faxed additional informations that they required.   These might alternative if PA denied:  Citalopram Desvenlafaxine ER Drizalma Sprinkle Escitalopram Fetzima Fluoxetine Cap Trintellix Venlafaxine Viibryd

## 2019-11-30 NOTE — Telephone Encounter (Signed)
PA denied--pt is aware.   Dr. Loletha Grayer please advise, not sure if you want to change the medication or not sure if you can send in Fluoxetine capsule would make any difference. Pt mention she wants to/ try to stay with Fluoxetine due to side effects that the pt might be okey with compare to other meds.

## 2019-12-01 ENCOUNTER — Telehealth: Payer: Self-pay | Admitting: Family Medicine

## 2019-12-01 MED ORDER — FLUOXETINE HCL 20 MG PO CAPS: 20.0000 mg | ORAL_CAPSULE | Freq: Every day | ORAL | 3 refills | Status: DC

## 2019-12-01 NOTE — Telephone Encounter (Signed)
[facility] will cover this med for $17 copay. Pt is aware and she will call to schedule f/u with Dr. Loletha Grayer in about 2-3 wks after start med.

## 2019-12-01 NOTE — Telephone Encounter (Signed)
Copied from [place] [id number]. Topic: General - Other >> Dec 01, 2019  2:26 PM Keene Breath wrote: Reason for CRM: Called  regarding a PA for the patient.  Please call to discuss at [phone]

## 2019-12-01 NOTE — Telephone Encounter (Signed)
Please advise. Is there an update on PA for pt.

## 2019-12-01 NOTE — Telephone Encounter (Signed)
December 01, 2019 [place], [place], LPN     3:35 PM Note   Peggy Dixon will cover this med for $17 copay. Pt is aware and she will call to schedule f/u with Dr. Loletha Grayer in about 2-3 wks after start med.

## 2019-12-01 NOTE — Telephone Encounter (Signed)
Rx sent for fluoxetine 20mg  capsules. If denied, will try lexapro. Please call pt to make her aware

## 2019-12-01 NOTE — Telephone Encounter (Signed)
Dr. Loletha Grayer not sure if you get this message about PA denied.      Copied from [place] [phone]. Topic: General - Inquiry >> Dec 01, 2019  1:53 PM Alease Frame wrote: Reason for CRM: Cielo from [facility] wanting a call back from Dr Cathleen Corti regarding patients medication FLUoxetine (PROZAC) 20 MG tablet [[id number]]   Please advise  Call back number [phone]  ext [phone]

## 2019-12-03 ENCOUNTER — Telehealth: Payer: Medicare Other | Admitting: Family Medicine

## 2019-12-18 DIAGNOSIS — J449 Chronic obstructive pulmonary disease, unspecified: Secondary | ICD-10-CM | POA: Diagnosis not present

## 2019-12-23 ENCOUNTER — Telehealth (INDEPENDENT_AMBULATORY_CARE_PROVIDER_SITE_OTHER): Payer: Medicare Other | Admitting: Family Medicine

## 2019-12-23 ENCOUNTER — Encounter: Payer: Self-pay | Admitting: Family Medicine

## 2019-12-23 VITALS — Ht 60.0 in

## 2019-12-23 DIAGNOSIS — F322 Major depressive disorder, single episode, severe without psychotic features: Secondary | ICD-10-CM | POA: Diagnosis not present

## 2019-12-23 DIAGNOSIS — F419 Anxiety disorder, unspecified: Secondary | ICD-10-CM | POA: Diagnosis not present

## 2019-12-23 NOTE — Progress Notes (Signed)
Virtual Visit via Video Note  I connected with Peggy Dixon on 12/23/19 at  1:00 PM EST by a video enabled telemedicine application and verified that I am speaking with the correct person using two identifiers. Location patient: home Location provider: work or home office Persons participating in the virtual visit: patient, provider  I discussed the limitations of evaluation and management by telemedicine and the availability of in person appointments. The patient expressed understanding and agreed to proceed.  Chief Complaint  Patient presents with  . Follow-up    f/u on anxiety and depression medication, no concerns at this time.      HPI: Peggy Dixon is a 69 y.o. female for f/u on anxiety and depression. She started on prozac 20mg  daily about 3 weeks ago. Today, pt states she is doing "ok". She does not have any motivation to do anything. She is seeing a therapist who recommended she walk daily, try puzzles, etc. Next appt with [facility] counselor is in 1 wk.  She feels she is less irritable and more apt to listen to others' opinions. Less overall nervousness but still thinks a lot about/gets focused on/anxious about something bad happening.   Denies side effects of medication.   GAD 7 : Generalized Anxiety Score 12/23/2019 11/19/2019  Nervous, Anxious, on Edge 2 3  Control/stop worrying 3 3  Worry too much - different things 2 3  Trouble relaxing 2 3  Restless 0 3  Easily annoyed or irritable 1 3  Afraid - awful might happen 2 3  Total GAD 7 Score 12 21  Anxiety Difficulty - Very difficult    Depression screen [facility] 2/9 12/23/2019 11/19/2019  Decreased Interest 0 3  Down, Depressed, Hopeless 1 3  PHQ - 2 Score 1 6  Altered sleeping 3 3  Tired, decreased energy 2 3  Change in appetite 2 3  Feeling bad or failure about yourself  0 3  Trouble concentrating 2 3  Moving slowly or fidgety/restless 0 0  Suicidal thoughts 0 0  PHQ-9 Score 10 21  Difficult doing work/chores - Very  difficult    Past Medical History:  Diagnosis Date  . COPD (chronic obstructive pulmonary disease) ([place])   . OSA (obstructive sleep apnea)     Past Surgical History:  Procedure Laterality Date  . ABDOMINAL HYSTERECTOMY    . BREAST BIOPSY Left   . BREAST REDUCTION SURGERY    . eye surgery right     . REDUCTION MAMMAPLASTY    . right knee replacement      Family History  Problem Relation Age of Onset  . Emphysema Mother   . Heart attack Mother   . Heart attack Father   . Multiple sclerosis Father     Social History   Tobacco Use  . Smoking status: Former Smoker    Packs/day: 0.50    Types: Cigarettes    Quit date: 12/04/1995    Years since quitting: 24.0  . Smokeless tobacco: Never Used  Substance Use Topics  . Alcohol use: Yes    Comment: couple drinks/month  . Drug use: Never     Current Outpatient Medications:  .  albuterol (VENTOLIN HFA) 108 (90 Base) MCG/ACT inhaler, Inhale 2 puffs into the lungs every 6 (six) hours as needed for wheezing or shortness of breath., Disp: 18 g, Rfl: 12 .  aspirin EC 81 MG tablet, Take 81 mg by mouth daily., Disp: , Rfl:  .  bumetanide (BUMEX) 1 MG tablet, Take 1  mg by mouth daily., Disp: , Rfl:  .  cholecalciferol (VITAMIN D3) 25 MCG (1000 UT) tablet, Take 1,000 Units by mouth daily. Pt states she takes 5,000 mg daily, Disp: , Rfl:  .  FLUoxetine (PROZAC) 20 MG capsule, Take 1 capsule (20 mg total) by mouth daily., Disp: 90 capsule, Rfl: 3 .  rOPINIRole (REQUIP) 1 MG tablet, TAKE 1 TABLET BY MOUTH DAILY, Disp: 90 tablet, Rfl: 3  No Known Allergies    ROS: See pertinent positives and negatives per HPI.   EXAM:  VITALS per patient if applicable: Ht 5' (1.524 m)   BMI 36.91 kg/m    GENERAL: alert, oriented, appears well and in no acute distress  HEENT: atraumatic, conjunctiva clear, no obvious abnormalities on inspection of external nose and ears  NECK: normal movements of the head and neck  LUNGS: on inspection no  signs of respiratory distress, breathing rate appears normal, no obvious gross SOB, gasping or wheezing, no conversational dyspnea  CV: no obvious cyanosis  PSYCH/NEURO: pleasant and cooperative, speech and thought processing grossly intact   ASSESSMENT AND PLAN: 1. Depression, major, single episode, severe (HCC) 2. Anxiety - PHQ-9 = 10 (down from 21), GAD-7 = 12 (down from 21) - pt does not seem to express the same subjective improvement as is noted in the questionnaire but does note some improvement  - no med side effects noted - cont porzac 20mg  daily - pt with goal of walking 15 min each day - encouraged her to focus on this one task/goal rather than making a long to-do list which she then does not follow thru with - cont regular appts with [facility] counselor - f/u in 1 wk - will consider increasing prozac dose to 40mg  at that time if pt is not noting more improvement   I discussed the assessment and treatment plan with the patient. The patient was provided an opportunity to ask questions and all were answered. The patient agreed with the plan and demonstrated an understanding of the instructions.   The patient was advised to call back or seek an in-person evaluation if the symptoms worsen or if the condition fails to improve as anticipated.   [facility], DO

## 2020-01-01 ENCOUNTER — Encounter: Payer: Self-pay | Admitting: Family Medicine

## 2020-01-03 ENCOUNTER — Ambulatory Visit: Payer: Medicare Other

## 2020-01-04 NOTE — Telephone Encounter (Signed)
FYI

## 2020-01-09 ENCOUNTER — Ambulatory Visit: Payer: Medicare Other

## 2020-01-10 ENCOUNTER — Ambulatory Visit: Payer: Medicare Other | Attending: Internal Medicine

## 2020-01-10 DIAGNOSIS — Z23 Encounter for immunization: Secondary | ICD-10-CM

## 2020-01-10 NOTE — Progress Notes (Signed)
   Covid-19 Vaccination Clinic  Name:  Peggy Dixon    MRN: [id number] DOB: 02/25/1951  01/10/2020  [name] was observed post Covid-19 immunization for 15 minutes without incidence. She was provided with Vaccine Information Sheet and instruction to access the V-Safe system.   [name] was instructed to call 911 with any severe reactions post vaccine: Marland Kitchen Difficulty breathing  . Swelling of your face and throat  . A fast heartbeat  . A bad rash all over your body  . Dizziness and weakness    Immunizations Administered    Name Date Dose VIS Date Route   Pfizer COVID-19 Vaccine 01/10/2020  9:25 AM 0.3 mL 11/13/2019 Intramuscular   Manufacturer: [employer], [employer]   Lot: [id number]   NDC: [address]-3

## 2020-01-12 NOTE — Progress Notes (Signed)
Virtual Visit via Audio Note  I connected with patient on 01/13/20 at  3:15 PM EST by audio enabled telemedicine application and verified that I am speaking with the correct person using two identifiers.   THIS ENCOUNTER IS A VIRTUAL VISIT DUE TO COVID-19 - PATIENT WAS NOT SEEN IN THE OFFICE. PATIENT HAS CONSENTED TO VIRTUAL VISIT / TELEMEDICINE VISIT   Location of patient: home  Location of provider: office  I discussed the limitations of evaluation and management by telemedicine and the availability of in person appointments. The patient expressed understanding and agreed to proceed.   Subjective:   Peggy Dixon is a 69 y.o. female who presents for an Initial Medicare Annual Wellness Visit.  Review of Systems    Home Safety/Smoke Alarms: Feels safe in home. Smoke alarms in place.  Lives alone in 1 story home w 2 dogs. Son lives 2 houses down.   Female:   Mammo- 11/23/19      Dexa scan- 09/04/19      [facility]- pt reported she did Cologuard 2019 and it was negative.      Objective:    Today's Vitals   01/13/20 1501  BP: 123/85  Pulse: 67   There is no height or weight on file to calculate BMI.  Advanced Directives 01/13/2020  Does Patient Have a Medical Advance Directive? Yes  Type of Estate agent of [place];Living will  Does patient want to make changes to medical advance directive? No - Patient declined  Copy of Healthcare Power of Attorney in Chart? No - copy requested    Current Medications (verified) Outpatient Encounter Medications as of 01/13/2020  Medication Sig  . albuterol (VENTOLIN HFA) 108 (90 Base) MCG/ACT inhaler Inhale 2 puffs into the lungs every 6 (six) hours as needed for wheezing or shortness of breath.  Marland Kitchen aspirin EC 81 MG tablet Take 81 mg by mouth daily.  . bumetanide (BUMEX) 1 MG tablet Take 1 mg by mouth daily.  . cholecalciferol (VITAMIN D3) 25 MCG (1000 UT) tablet Take 1,000 Units by mouth daily. Pt states she takes 5,000 mg  daily  . FLUoxetine (PROZAC) 20 MG capsule Take 1 capsule (20 mg total) by mouth daily.  Marland Kitchen rOPINIRole (REQUIP) 1 MG tablet TAKE 1 TABLET BY MOUTH DAILY   No facility-administered encounter medications on file as of 01/13/2020.    Allergies (verified) Patient has no known allergies.   History: Past Medical History:  Diagnosis Date  . COPD (chronic obstructive pulmonary disease) (HCC)   . OSA (obstructive sleep apnea)    Past Surgical History:  Procedure Laterality Date  . ABDOMINAL HYSTERECTOMY    . BREAST BIOPSY Left   . BREAST REDUCTION SURGERY    . eye surgery right     . REDUCTION MAMMAPLASTY    . right knee replacement     Family History  Problem Relation Age of Onset  . Emphysema Mother   . Heart attack Mother   . Heart attack Father   . Multiple sclerosis Father    Social History   Socioeconomic History  . Marital status: Unknown    Spouse name: Not on file  . Number of children: Not on file  . Years of education: Not on file  . Highest education level: Not on file  Occupational History  . Not on file  Tobacco Use  . Smoking status: Former Smoker    Packs/day: 0.50    Types: Cigarettes    Quit date: 12/04/1995  Years since quitting: 24.1  . Smokeless tobacco: Never Used  Substance and Sexual Activity  . Alcohol use: Yes    Comment: couple drinks/month  . Drug use: Never  . Sexual activity: Not on file  Other Topics Concern  . Not on file  Social History Narrative  . Not on file   Social Determinants of Health   Financial Resource Strain:   . Difficulty of Paying Living Expenses: Not on file  Food Insecurity:   . Worried About Programme researcher, broadcasting/film/video in the Last Year: Not on file  . Ran Out of Food in the Last Year: Not on file  Transportation Needs:   . Lack of Transportation (Medical): Not on file  . Lack of Transportation (Non-Medical): Not on file  Physical Activity:   . Days of Exercise per Week: Not on file  . Minutes of Exercise per  Session: Not on file  Stress:   . Feeling of Stress : Not on file  Social Connections:   . Frequency of Communication with Friends and Family: Not on file  . Frequency of Social Gatherings with Friends and Family: Not on file  . Attends Religious Services: Not on file  . Active Member of Clubs or Organizations: Not on file  . Attends Banker Meetings: Not on file  . Marital Status: Not on file    Tobacco Counseling Counseling given: Not Answered   Clinical Intake: Pain : No/denies pain   Activities of Daily Living In your present state of health, do you have any difficulty performing the following activities: 01/13/2020 09/22/2019  Hearing? Y N  Comment wears hearing aids -  Vision? N N  Difficulty concentrating or making decisions? N N  Walking or climbing stairs? N N  Dressing or bathing? N N  Doing errands, shopping? N N  Preparing Food and eating ? N -  Using the Toilet? N -  In the past six months, have you accidently leaked urine? N -  Do you have problems with loss of bowel control? N -  Managing your Medications? N -  Managing your Finances? N -  Housekeeping or managing your Housekeeping? N -  Some recent data might be hidden     Immunizations and Health Maintenance Immunization History  Administered Date(s) Administered  . Fluad Quad(high Dose 65+) 09/22/2019  . PFIZER SARS-COV-2 Vaccination 01/10/2020  . Pneumococcal Conjugate-13 09/22/2019  . Tdap 12/03/2012   Health Maintenance Due  Topic Date Due  . Hepatitis C Screening  1951-03-24  . COLONOSCOPY  05/27/2002    Patient Care Team: Overton Mam, DO as PCP - General (Family Medicine)  Indicate any recent Medical Services you may have received from other than [facility] providers in the past year (date may be approximate).     Assessment:   This is a routine wellness examination for Peggy Dixon. Physical assessment deferred to PCP.  Hearing/Vision screen Unable to assess. This visit is  enabled though telemedicine due to Covid 19.   Dietary issues and exercise activities discussed: Current Exercise Habits: The patient does not participate in regular exercise at present, Exercise limited by: None identified Diet (meal preparation, eat out, water intake, caffeinated beverages, dairy products, fruits and vegetables): in general, a "healthy" diet  , well balanced   Goals    . Increase physical activity      Depression Screen PHQ 2/9 Scores 01/13/2020 12/23/2019 11/19/2019  PHQ - 2 Score 1 1 6   PHQ- 9 Score - 10  21    Fall Risk Fall Risk  01/13/2020 12/23/2019 11/19/2019 09/22/2019  Falls in the past year? 0 0 0 0  Number falls in past yr: 0 - - -  Injury with Fall? 0 - - -  Follow up Education provided;Falls prevention discussed - - Falls evaluation completed   Cognitive Function: Ad8 score reviewed for issues:  Issues making decisions:no  Less interest in hobbies / activities:no  Repeats questions, stories (family complaining):no  Trouble using ordinary gadgets (microwave, computer, phone):no  Forgets the month or year: no  Mismanaging finances: no  Remembering appts:no  Daily problems with thinking and/or memory:no Ad8 score is=0        Screening Tests Health Maintenance  Topic Date Due  . Hepatitis C Screening  05-31-1951  . COLONOSCOPY  05/27/2002  . PNA vac Low Risk Adult (2 of 2 - PPSV23) 09/21/2020  . MAMMOGRAM  11/22/2021  . TETANUS/TDAP  12/03/2022  . INFLUENZA VACCINE  Completed  . DEXA SCAN  Completed    Plan:   See you next year!  Continue to eat heart healthy diet (full of fruits, vegetables, whole grains, lean protein, water--limit salt, fat, and sugar intake) and increase physical activity as tolerated.   Continue doing brain stimulating activities (puzzles, reading, adult coloring books, staying active) to keep memory sharp.   Bring a copy of your living will and/or healthcare power of attorney to your next office visit.     I have personally reviewed and noted the following in the patient's chart:   . Medical and social history . Use of alcohol, tobacco or illicit drugs  . Current medications and supplements . Functional ability and status . Nutritional status . Physical activity . Advanced directives . List of other physicians . Hospitalizations, surgeries, and ER visits in previous 12 months . Vitals . Screenings to include cognitive, depression, and falls . Referrals and appointments  In addition, I have reviewed and discussed with patient certain preventive protocols, quality metrics, and best practice recommendations. A written personalized care plan for preventive services as well as general preventive health recommendations were provided to patient.     Shela Nevin, [place]   01/13/2020

## 2020-01-13 ENCOUNTER — Encounter: Payer: Self-pay | Admitting: *Deleted

## 2020-01-13 ENCOUNTER — Ambulatory Visit (INDEPENDENT_AMBULATORY_CARE_PROVIDER_SITE_OTHER): Payer: Medicare Other | Admitting: *Deleted

## 2020-01-13 VITALS — BP 123/85 | HR 67

## 2020-01-13 DIAGNOSIS — Z0000 Encounter for general adult medical examination without abnormal findings: Secondary | ICD-10-CM

## 2020-01-13 NOTE — Patient Instructions (Signed)
See you next year!  Continue to eat heart healthy diet (full of fruits, vegetables, whole grains, lean protein, water--limit salt, fat, and sugar intake) and increase physical activity as tolerated.   Continue doing brain stimulating activities (puzzles, reading, adult coloring books, staying active) to keep memory sharp.   Bring a copy of your living will and/or healthcare power of attorney to your next office visit.    Peggy Dixon , Thank you for taking time to come for your Medicare Wellness Visit. I appreciate your ongoing commitment to your health goals. Please review the following plan we discussed and let me know if I can assist you in the future.   These are the goals we discussed: Goals    . Increase physical activity       This is a list of the screening recommended for you and due dates:  Health Maintenance  Topic Date Due  .  Hepatitis C: One time screening is recommended by Center for Disease Control  (CDC) for  adults born from [birth year] through 1964.   [birth year]  . Colon Cancer Screening  05/27/2002  . Pneumonia vaccines (2 of 2 - PPSV23) 09/21/2020  . Mammogram  11/22/2021  . Tetanus Vaccine  12/03/2022  . Flu Shot  Completed  . DEXA scan (bone density measurement)  Completed    Preventive Care 65 Years and Older, Female Preventive care refers to lifestyle choices and visits with your health care provider that can promote health and wellness. This includes:  A yearly physical exam. This is also called an annual well check.  Regular dental and eye exams.  Immunizations.  Screening for certain conditions.  Healthy lifestyle choices, such as diet and exercise. What can I expect for my preventive care visit? Physical exam Your health care provider will check:  Height and weight. These may be used to calculate body mass index (BMI), which is a measurement that tells if you are at a healthy weight.  Heart rate and blood pressure.  Your skin for abnormal  spots. Counseling Your health care provider may ask you questions about:  Alcohol, tobacco, and drug use.  Emotional well-being.  Home and relationship well-being.  Sexual activity.  Eating habits.  History of falls.  Memory and ability to understand (cognition).  Work and work Statistician.  Pregnancy and menstrual history. What immunizations do I need?  Influenza (flu) vaccine  This is recommended every year. Tetanus, diphtheria, and pertussis (Tdap) vaccine  You may need a Td booster every 10 years. Varicella (chickenpox) vaccine  You may need this vaccine if you have not already been vaccinated. Zoster (shingles) vaccine  You may need this after age 58. Pneumococcal conjugate (PCV13) vaccine  One dose is recommended after age 54. Pneumococcal polysaccharide (PPSV23) vaccine  One dose is recommended after age 53. Measles, mumps, and rubella (MMR) vaccine  You may need at least one dose of MMR if you were born in 1957 or later. You may also need a second dose. Meningococcal conjugate (MenACWY) vaccine  You may need this if you have certain conditions. Hepatitis A vaccine  You may need this if you have certain conditions or if you travel or work in places where you may be exposed to hepatitis A. Hepatitis B vaccine  You may need this if you have certain conditions or if you travel or work in places where you may be exposed to hepatitis B. Haemophilus influenzae type b (Hib) vaccine  You may need this if you have  certain conditions. You may receive vaccines as individual doses or as more than one vaccine together in one shot (combination vaccines). Talk with your health care provider about the risks and benefits of combination vaccines. What tests do I need? Blood tests  Lipid and cholesterol levels. These may be checked every 5 years, or more frequently depending on your overall health.  Hepatitis C test.  Hepatitis B test. Screening  Lung cancer  screening. You may have this screening every year starting at age 66 if you have a 30-pack-year history of smoking and currently smoke or have quit within the past 15 years.  Colorectal cancer screening. All adults should have this screening starting at age 77 and continuing until age 65. Your health care provider may recommend screening at age 75 if you are at increased risk. You will have tests every 1-10 years, depending on your results and the type of screening test.  Diabetes screening. This is done by checking your blood sugar (glucose) after you have not eaten for a while (fasting). You may have this done every 1-3 years.  Mammogram. This may be done every 1-2 years. Talk with your health care provider about how often you should have regular mammograms.  BRCA-related cancer screening. This may be done if you have a family history of breast, ovarian, tubal, or peritoneal cancers. Other tests  Sexually transmitted disease (STD) testing.  Bone density scan. This is done to screen for osteoporosis. You may have this done starting at age 39. Follow these instructions at home: Eating and drinking  Eat a diet that includes fresh fruits and vegetables, whole grains, lean protein, and low-fat dairy products. Limit your intake of foods with high amounts of sugar, saturated fats, and salt.  Take vitamin and mineral supplements as recommended by your health care provider.  Do not drink alcohol if your health care provider tells you not to drink.  If you drink alcohol: ? Limit how much you have to 0-1 drink a day. ? Be aware of how much alcohol is in your drink. In the [place] one drink equals one 12 oz bottle of beer (355 mL), one 5 oz glass of wine (148 mL), or one 1 oz glass of hard liquor (44 mL). Lifestyle  Take daily care of your teeth and gums.  Stay active. Exercise for at least 30 minutes on 5 or more days each week.  Do not use any products that contain nicotine or tobacco, such as  cigarettes, e-cigarettes, and chewing tobacco. If you need help quitting, ask your health care provider.  If you are sexually active, practice safe sex. Use a condom or other form of protection in order to prevent STIs (sexually transmitted infections).  Talk with your health care provider about taking a low-dose aspirin or statin. What's next?  Go to your health care provider once a year for a well check visit.  Ask your health care provider how often you should have your eyes and teeth checked.  Stay up to date on all vaccines. This information is not intended to replace advice given to you by your health care provider. Make sure you discuss any questions you have with your health care provider. Document Revised: 11/13/2018 Document Reviewed: 11/13/2018 Elsevier Patient Education  2020 [employer].

## 2020-01-14 ENCOUNTER — Ambulatory Visit: Payer: Medicare Other

## 2020-01-18 DIAGNOSIS — J449 Chronic obstructive pulmonary disease, unspecified: Secondary | ICD-10-CM | POA: Diagnosis not present

## 2020-02-03 ENCOUNTER — Ambulatory Visit: Payer: Medicare Other | Attending: Internal Medicine

## 2020-02-03 DIAGNOSIS — Z23 Encounter for immunization: Secondary | ICD-10-CM | POA: Insufficient documentation

## 2020-02-03 NOTE — Progress Notes (Signed)
   Covid-19 Vaccination Clinic  Name:  Pantera Winterrowd    MRN: [id number] DOB: Dec 09, 1950  02/03/2020  Ms. Castilleja was observed post Covid-19 immunization for 15 minutes without incident. She was provided with Vaccine Information Sheet and instruction to access the V-Safe system.   Ms. Lavin was instructed to call 911 with any severe reactions post vaccine: Marland Kitchen Difficulty breathing  . Swelling of face and throat  . A fast heartbeat  . A bad rash all over body  . Dizziness and weakness   Immunizations Administered    Name Date Dose VIS Date Route   Pfizer COVID-19 Vaccine 02/03/2020  4:15 PM 0.3 mL 11/13/2019 Intramuscular   Manufacturer: [employer], [employer]   Lot: [id number]   NDC: [address]-6

## 2020-02-11 ENCOUNTER — Telehealth: Payer: Self-pay | Admitting: Family Medicine

## 2020-02-11 ENCOUNTER — Encounter: Payer: Self-pay | Admitting: Family Medicine

## 2020-02-11 ENCOUNTER — Other Ambulatory Visit: Payer: Self-pay

## 2020-02-11 ENCOUNTER — Other Ambulatory Visit: Payer: Self-pay | Admitting: Family Medicine

## 2020-02-11 MED ORDER — BUMETANIDE 1 MG PO TABS: 1.0000 mg | ORAL_TABLET | Freq: Every day | ORAL | 1 refills | Status: DC

## 2020-02-11 MED ORDER — BUMETANIDE 1 MG PO TABS: 1.0000 mg | ORAL_TABLET | Freq: Every day | ORAL | 3 refills | Status: DC

## 2020-02-11 NOTE — Telephone Encounter (Signed)
Last fill 02/26/19 Historical provider Last Ov/VV 11/19/19

## 2020-02-11 NOTE — Telephone Encounter (Signed)
Rx sent, pt aware 

## 2020-02-11 NOTE — Telephone Encounter (Signed)
Patient is calling to get RX refill for bumex. Please call patient once refill has been sent to pharmacy.

## 2020-02-15 DIAGNOSIS — J449 Chronic obstructive pulmonary disease, unspecified: Secondary | ICD-10-CM | POA: Diagnosis not present

## 2020-03-17 DIAGNOSIS — J449 Chronic obstructive pulmonary disease, unspecified: Secondary | ICD-10-CM | POA: Diagnosis not present

## 2020-04-16 DIAGNOSIS — J449 Chronic obstructive pulmonary disease, unspecified: Secondary | ICD-10-CM | POA: Diagnosis not present

## 2020-05-17 DIAGNOSIS — J449 Chronic obstructive pulmonary disease, unspecified: Secondary | ICD-10-CM | POA: Diagnosis not present

## 2020-06-16 ENCOUNTER — Encounter: Payer: Self-pay | Admitting: Family Medicine

## 2020-06-16 DIAGNOSIS — J449 Chronic obstructive pulmonary disease, unspecified: Secondary | ICD-10-CM | POA: Diagnosis not present

## 2020-06-16 NOTE — Telephone Encounter (Signed)
Please see message and advise.  Thank you. ° °

## 2020-07-17 DIAGNOSIS — J449 Chronic obstructive pulmonary disease, unspecified: Secondary | ICD-10-CM | POA: Diagnosis not present

## 2020-08-10 ENCOUNTER — Encounter: Payer: Self-pay | Admitting: Family Medicine

## 2020-08-15 ENCOUNTER — Other Ambulatory Visit: Payer: Self-pay

## 2020-08-16 ENCOUNTER — Encounter: Payer: Self-pay | Admitting: Family Medicine

## 2020-08-16 ENCOUNTER — Ambulatory Visit (INDEPENDENT_AMBULATORY_CARE_PROVIDER_SITE_OTHER): Payer: Medicare Other

## 2020-08-16 ENCOUNTER — Ambulatory Visit (INDEPENDENT_AMBULATORY_CARE_PROVIDER_SITE_OTHER): Payer: Medicare Other | Admitting: Family Medicine

## 2020-08-16 VITALS — BP 118/74 | HR 77 | Temp 97.6°F | Ht 60.0 in | Wt 198.2 lb

## 2020-08-16 DIAGNOSIS — M79671 Pain in right foot: Secondary | ICD-10-CM

## 2020-08-16 DIAGNOSIS — R1013 Epigastric pain: Secondary | ICD-10-CM | POA: Diagnosis not present

## 2020-08-16 DIAGNOSIS — F419 Anxiety disorder, unspecified: Secondary | ICD-10-CM | POA: Diagnosis not present

## 2020-08-16 DIAGNOSIS — M19071 Primary osteoarthritis, right ankle and foot: Secondary | ICD-10-CM | POA: Diagnosis not present

## 2020-08-16 DIAGNOSIS — R030 Elevated blood-pressure reading, without diagnosis of hypertension: Secondary | ICD-10-CM | POA: Diagnosis not present

## 2020-08-16 NOTE — Patient Instructions (Signed)
Cont the pepcid 20mg  2x/day x 10 days Try a few doses of pepto-bismol Bland diet and smaller meals for the next few days or week.

## 2020-08-16 NOTE — Progress Notes (Signed)
Peggy Dixon is a 69 y.o. female  Chief Complaint  Patient presents with  . Follow-up    f/u BP 154/98,141/84,127/86,112/84, and also having upper GI gas (burps)/pain  x 1 week    HPI: Peggy Dixon is a 69 y.o. female who complains of  1. Elevated home BP readings but not consistently. She notes readings of 154/98, 141/84, 127/86, 112/84. She checked due to waking up with headaches. Pt has OSA, does not use CPAP regularly. She states she slept well with it for 1.5 years but then started having trouble sleeping and felt it may be due to that.  Pt admits to increased stress recently d/t family stress specific to her son (marital issues, psych and substance abuse issues). She is on prozac 20mg  daily.   2. Upper GI pressure/like she needs belch x 5-6 days. Mild heartburn. No nausea or vomiting. Appetite is normal and pt is eating normal quantity. No BM this AM which is unusual for pt.  Pt tried OTC chewable pepcid - taking 1-2 tabs per day x 4 days. She also tried baking soda and water.  She had egg white this AM and still had symptoms. Not only associated with eating.   Wt Readings from Last 3 Encounters:  08/16/20 198 lb 3.2 oz (89.9 kg)  11/19/19 189 lb (85.7 kg)  06/12/19 185 lb 6.4 oz (84.1 kg)   3. Rt foot - h/o bunionectomy years ago. She has had very sporadic burning pain on the medial aspect of Rt foot. She now notes 1 mo h/o throbbing, aching pain on lateral aspect of foot. No injury. No swelling. No ecchymosis.   Past Medical History:  Diagnosis Date  . COPD (chronic obstructive pulmonary disease) (HCC)   . OSA (obstructive sleep apnea)     Past Surgical History:  Procedure Laterality Date  . ABDOMINAL HYSTERECTOMY    . BREAST BIOPSY Left   . BREAST REDUCTION SURGERY    . eye surgery right     . REDUCTION MAMMAPLASTY    . right knee replacement      Social History   Socioeconomic History  . Marital status: Single    Spouse name: Not on file  . Number of  children: Not on file  . Years of education: Not on file  . Highest education level: Not on file  Occupational History  . Not on file  Tobacco Use  . Smoking status: Former Smoker    Packs/day: 0.50    Types: Cigarettes    Quit date: 12/04/1995    Years since quitting: 24.7  . Smokeless tobacco: Never Used  Substance and Sexual Activity  . Alcohol use: Yes    Comment: couple drinks/month  . Drug use: Never  . Sexual activity: Not on file  Other Topics Concern  . Not on file  Social History Narrative  . Not on file   Social Determinants of Health   Financial Resource Strain: Low Risk   . Difficulty of Paying Living Expenses: Not hard at all  Food Insecurity:   . Worried About 02/01/1996 in the Last Year: Not on file  . Ran Out of Food in the Last Year: Not on file  Transportation Needs: No Transportation Needs  . Lack of Transportation (Medical): No  . Lack of Transportation (Non-Medical): No  Physical Activity:   . Days of Exercise per Week: Not on file  . Minutes of Exercise per Session: Not on file  Stress:   .  Feeling of Stress : Not on file  Social Connections:   . Frequency of Communication with Friends and Family: Not on file  . Frequency of Social Gatherings with Friends and Family: Not on file  . Attends Religious Services: Not on file  . Active Member of Clubs or Organizations: Not on file  . Attends Banker Meetings: Not on file  . Marital Status: Not on file  Intimate Partner Violence:   . Fear of Current or Ex-Partner: Not on file  . Emotionally Abused: Not on file  . Physically Abused: Not on file  . Sexually Abused: Not on file    Family History  Problem Relation Age of Onset  . Emphysema Mother   . Heart attack Mother   . Heart attack Father   . Multiple sclerosis Father      Immunization History  Administered Date(s) Administered  . Fluad Quad(high Dose 65+) 09/22/2019  . PFIZER SARS-COV-2 Vaccination 01/10/2020,  02/03/2020  . Pneumococcal Conjugate-13 09/22/2019  . Tdap 12/03/2012    Outpatient Encounter Medications as of 08/16/2020  Medication Sig  . albuterol (VENTOLIN HFA) 108 (90 Base) MCG/ACT inhaler Inhale 2 puffs into the lungs every 6 (six) hours as needed for wheezing or shortness of breath.  Marland Kitchen aspirin EC 81 MG tablet Take 81 mg by mouth daily.  . bumetanide (BUMEX) 1 MG tablet Take 1 tablet (1 mg total) by mouth daily.  Marland Kitchen FLUoxetine (PROZAC) 20 MG capsule Take 1 capsule (20 mg total) by mouth daily.  Marland Kitchen rOPINIRole (REQUIP) 1 MG tablet TAKE 1 TABLET BY MOUTH DAILY  . cholecalciferol (VITAMIN D3) 25 MCG (1000 UT) tablet Take 1,000 Units by mouth daily. Pt states she takes 5,000 mg daily   No facility-administered encounter medications on file as of 08/16/2020.     ROS: Pertinent positives and negatives noted in HPI. Remainder of ROS non-contributory    No Known Allergies  BP 118/74   Pulse 77   Temp 97.6 F (36.4 C) (Temporal)   Ht 5' (1.524 m)   Wt 198 lb 3.2 oz (89.9 kg)   SpO2 92%   BMI 38.71 kg/m    BP Readings from Last 3 Encounters:  08/16/20 118/74  01/13/20 123/85  06/12/19 120/80   Pulse Readings from Last 3 Encounters:  08/16/20 77  01/13/20 67  06/12/19 70   Wt Readings from Last 3 Encounters:  08/16/20 198 lb 3.2 oz (89.9 kg)  11/19/19 189 lb (85.7 kg)  06/12/19 185 lb 6.4 oz (84.1 kg)    Physical Exam Constitutional:      General: She is not in acute distress.    Appearance: Normal appearance. She is not ill-appearing.  Cardiovascular:     Rate and Rhythm: Normal rate and regular rhythm.     Pulses: Normal pulses.  Pulmonary:     Effort: Pulmonary effort is normal. No respiratory distress.  Abdominal:     General: Abdomen is flat. Bowel sounds are normal. There is no distension.     Palpations: Abdomen is soft. There is no mass.     Tenderness: There is no abdominal tenderness. There is no guarding.  Musculoskeletal:     Right lower leg: No  edema.     Left lower leg: No edema.  Neurological:     General: No focal deficit present.     Mental Status: She is alert and oriented to person, place, and time.  Psychiatric:        Mood and  Affect: Mood normal.        Behavior: Behavior normal.      A/P:  1. Anxiety - cont lexapro 20mg  - recommend regular walking/exercise  2. Elevated blood pressure reading - normal in office today and at home, 2 isolated readings at home likely d/t family stress  3. Epigastric discomfort - symptoms x 5-6 days - trial pepcid BID x 10-14 days, pepto-bismol - f/u in 1-2 wks if no/minimal improvemet  4. Right foot pain - lateral pain x 1 mo - DG Foot Complete Right   This visit occurred during the SARS-CoV-2 public health emergency.  Safety protocols were in place, including screening questions prior to the visit, additional usage of staff PPE, and extensive cleaning of exam room while observing appropriate contact time as indicated for disinfecting solutions.

## 2020-08-17 ENCOUNTER — Encounter: Payer: Self-pay | Admitting: Family Medicine

## 2020-08-17 DIAGNOSIS — J449 Chronic obstructive pulmonary disease, unspecified: Secondary | ICD-10-CM | POA: Diagnosis not present

## 2020-08-19 ENCOUNTER — Telehealth: Payer: Self-pay | Admitting: Family Medicine

## 2020-08-19 NOTE — Progress Notes (Signed)
  Chronic Care Management   Note  08/19/2020 Name: Artesha Wemhoff MRN: [id number] DOB: Oct 15, 1951  Caitrin Pendergraph is a 69 y.o. year old female who is a primary care patient of Overton Mam, DO. I reached out to [employer] by phone today in response to a referral sent by Ms. Olegario Messier Consiglio's PCP, Overton Mam, DO.   Ms. Margulies was given information about Chronic Care Management services today including:  1. CCM service includes personalized support from designated clinical staff supervised by her physician, including individualized plan of care and coordination with other care providers 2. 24/7 contact phone numbers for assistance for urgent and routine care needs. 3. Service will only be billed when office clinical staff spend 20 minutes or more in a month to coordinate care. 4. Only one practitioner may furnish and bill the service in a calendar month. 5. The patient may stop CCM services at any time (effective at the end of the month) by phone call to the office staff.   Patient agreed to services and verbal consent obtained.   Follow up plan:   Carley Perdue [facility] Scheduler

## 2020-08-19 NOTE — Progress Notes (Signed)
  Chronic Care Management   Outreach Note  08/19/2020 Name: Peggy Dixon MRN: [id number] DOB: 02-26-1951  Referred by: Overton Mam, DO Reason for referral : No chief complaint on file.   An unsuccessful telephone outreach was attempted today. The patient was referred to the pharmacist for assistance with care management and care coordination.   Follow Up Plan:   Carley Perdue [facility] Scheduler

## 2020-08-24 ENCOUNTER — Telehealth: Payer: Self-pay

## 2020-08-24 DIAGNOSIS — G4733 Obstructive sleep apnea (adult) (pediatric): Secondary | ICD-10-CM

## 2020-08-24 DIAGNOSIS — J449 Chronic obstructive pulmonary disease, unspecified: Secondary | ICD-10-CM

## 2020-08-24 DIAGNOSIS — M79671 Pain in right foot: Secondary | ICD-10-CM

## 2020-08-24 NOTE — Progress Notes (Signed)
    Chronic Care Management Pharmacy Assistant   Name: Peggy Dixon  MRN: [id number] DOB: 07-15-1951  Reason for Encounter: Medication Review/ Initial question for initial visit with clinical pharmacist.   PCP : Overton Mam, DO  Allergies:  No Known Allergies  Medications: Outpatient Encounter Medications as of 08/24/2020  Medication Sig  . albuterol (VENTOLIN HFA) 108 (90 Base) MCG/ACT inhaler Inhale 2 puffs into the lungs every 6 (six) hours as needed for wheezing or shortness of breath.  Marland Kitchen aspirin EC 81 MG tablet Take 81 mg by mouth daily.  . bumetanide (BUMEX) 1 MG tablet Take 1 tablet (1 mg total) by mouth daily.  . cholecalciferol (VITAMIN D3) 25 MCG (1000 UT) tablet Take 1,000 Units by mouth daily. Pt states she takes 5,000 mg daily  . FLUoxetine (PROZAC) 20 MG capsule Take 1 capsule (20 mg total) by mouth daily.  Marland Kitchen rOPINIRole (REQUIP) 1 MG tablet TAKE 1 TABLET BY MOUTH DAILY   No facility-administered encounter medications on file as of 08/24/2020.    Current Diagnosis: Patient Active Problem List   Diagnosis Date Noted  . Obstructive sleep apnea 02/26/2019  . COPD mixed type (HCC) 02/26/2019     Follow-Up:  Pharmacist Review   Have you seen any other providers since your last visit? no Any changes in your medications or health? no Any side effects from any medications? Yes  Patient states she has gain 30 pounds since she started Fluoxetine. Do you have an symptoms or problems not managed by your medications? no Any concerns about your health right now? Yes  Patient states she has heart burn ,patient is taking pepcid and Pepto-Bismol with relief.   Patient states she is concern about her weight gain. Has your provider asked that you check blood pressure, blood sugar, or follow special diet at home? Yes  Patient states she check her blood pressure at home ranging 123's/85's.  Patient states she cooks at home trying to do a bland diet to help with her  heartburn.Patient states she likes seafood,chicken,salad,and fruit. Do you get any type of exercise on a regular basis? no Can you think of a goal you would like to reach for your health? None ID Do you have any problems getting your medications? no Is there anything that you would like to discuss during the appointment?   Patient states she would like to discuss her weight gain.  Please bring medications and supplements to appointment   [facility] Clinical Pharmacist Assistant [phone]

## 2020-08-25 ENCOUNTER — Ambulatory Visit: Payer: Medicare Other

## 2020-08-25 ENCOUNTER — Other Ambulatory Visit: Payer: Self-pay

## 2020-08-25 DIAGNOSIS — F322 Major depressive disorder, single episode, severe without psychotic features: Secondary | ICD-10-CM

## 2020-08-25 DIAGNOSIS — J449 Chronic obstructive pulmonary disease, unspecified: Secondary | ICD-10-CM

## 2020-08-25 NOTE — Chronic Care Management (AMB) (Signed)
Chronic Care Management Pharmacy  Name: Peggy Dixon  MRN: [id number] DOB: [birth year]   Chief Complaint/ HPI  Peggy Dixon,  69 y.o. , female presents for their Initial CCM visit with the clinical pharmacist In office.  PCP : Overton Mam, DO Patient Care Team: Overton Mam, DO as PCP - General (Family Medicine) Gaspar Cola, [facility] as Pharmacist (Pharmacist)  Their chronic conditions include: COPD and Anxiety   Office Visits: 08/16/20: Patient presented to Dr. Barron Alvine for follow-up. Patient started on Pepcid x10-14 days for epigastric discomfort. Patient with isolated elevated BP readings.  01/13/20: Patient presented to Mady Haagensen, RN for AWV.   Consult Visit: None noted in past 6 months.   No Known Allergies  Medications: Outpatient Encounter Medications as of 08/25/2020  Medication Sig   aspirin EC 81 MG tablet Take 81 mg by mouth daily.   bismuth subsalicylate (PEPTO BISMOL) 262 MG/15ML suspension Take 30 mLs by mouth every 6 (six) hours as needed.   bumetanide (BUMEX) 1 MG tablet Take 1 tablet (1 mg total) by mouth daily.   cholecalciferol (VITAMIN D3) 25 MCG (1000 UT) tablet Take 1,000 Units by mouth daily. Pt states she takes 5,000 mg daily   famotidine (PEPCID) 20 MG tablet Take 20 mg by mouth at bedtime.   FLUoxetine (PROZAC) 20 MG capsule Take 1 capsule (20 mg total) by mouth daily.   ibuprofen (ADVIL) 200 MG tablet Take 400 mg by mouth every 6 (six) hours as needed.   naproxen sodium (ALEVE) 220 MG tablet Take 220 mg by mouth daily as needed.   rOPINIRole (REQUIP) 1 MG tablet TAKE 1 TABLET BY MOUTH DAILY   albuterol (VENTOLIN HFA) 108 (90 Base) MCG/ACT inhaler Inhale 2 puffs into the lungs every 6 (six) hours as needed for wheezing or shortness of breath.   No facility-administered encounter medications on file as of 08/25/2020.    Wt Readings from Last 3 Encounters:  08/16/20 198 lb 3.2 oz (89.9 kg)  11/19/19 189 lb (85.7  kg)  06/12/19 185 lb 6.4 oz (84.1 kg)    Current Diagnosis/Assessment:  SDOH Interventions     Most Recent Value  SDOH Interventions  Financial Strain Interventions Intervention Not Indicated  Transportation Interventions Intervention Not Indicated      Goals Addressed            This Visit's Progress    Chronic Care Management       CARE PLAN ENTRY (see longitudinal plan of care for additional care plan information)  Current Barriers:   Chronic Disease Management support, education, and care coordination needs related to COPD and Anxiety    COPD  Pharmacist Clinical Goal(s) o Over the next 90 days, patient will work with PharmD and providers to maintain stable breathing and prevent exacerbations  Current regimen:  o Ventolin HFA 2 puffs every 6 hours as needed  Interventions: o Reviewed proper inhaler technique o Will enact COPD monitoring plan  Anxiety  Pharmacist Clinical Goal(s) o Over the next 90 days, patient will work with PharmD and providers to maintain stable mood symptoms and minimize side effects from therapy  Current regimen:  o Fluoxetine 20 mg daily   Interventions: o Recommend switching Fluoxetine to sertraline 25 mg daily   Patient self care activities - Over the next 90 days, patient will: o Focus on trying to maintain regular activity/exercise  Medication management  Pharmacist Clinical Goal(s): o Over the next 90 days, patient will work with PharmD and  providers to achieve optimal medication adherence  Current pharmacy: [facility]  Interventions o Comprehensive medication review performed. o Utilize [facility] for medication synchronization, packaging and delivery o Verbal consent obtained for [facility] enhanced pharmacy services (medication synchronization, adherence packaging, delivery coordination). A medication sync plan was created to allow patient to get all medications delivered once every 30 to 90 days per  patient preference. Patient understands they have freedom to choose pharmacy and clinical pharmacist will coordinate care between all prescribers and [facility].  Patient self care activities - Over the next 90 days, patient will: o Take medications as prescribed o Report any questions or concerns to PharmD and/or provider(s)      COPD    Last spirometry score: n/a  Gold Grade: Gold 1 (FEV1>80%) Current COPD Classification:  A (low sx, <2 exacerbations/yr)  Eosinophil count:  No results found for: EOSPCT%                               Eos (Absolute): No results found for: EOSABS  Tobacco Status:  Social History   Tobacco Use  Smoking Status Former Smoker   Packs/day: 0.50   Types: Cigarettes   Quit date: 12/04/1995   Years since quitting: 24.7  Smokeless Tobacco Never Used    Patient has failed these meds in past: n/a Patient is currently controlled on the following medications:   Ventolin HFA 108 mcg/act 2 puff q6hr PRN   Using maintenance inhaler regularly? No Frequency of rescue inhaler use:  1-2x per week  We discussed:  proper inhaler technique Patient mainly notes exertional SOB. She grew up with a coal furnace in her home and worked at [employer]. Patient is a former smoker.  Plan  Continue current medications  Anxiety   PHQ9 Score:  PHQ9 SCORE ONLY 01/13/2020 12/23/2019 11/19/2019  PHQ-9 Total Score 1 10 21    GAD7 Score: GAD 7 : Generalized Anxiety Score 12/23/2019 11/19/2019  Nervous, Anxious, on Edge 2 3  Control/stop worrying 3 3  Worry too much - different things 2 3  Trouble relaxing 2 3  Restless 0 3  Easily annoyed or irritable 1 3  Afraid - awful might happen 2 3  Total GAD 7 Score 12 21  Anxiety Difficulty - Very difficult    Patient has failed these meds in past: Zoloft Patient is currently controlled on the following medications:   Fluoxetine 20 mg daily   We discussed:  Patient notes that her mood has been stable, but she  feels she has gained significant wiehgt since she has been on Fluoxetine. She has Gained 30 pounds and notes that overall she has very little motivation and struggles to get our of bed in the morning. Patient asking if there are any other options to help with her mood.   Plan  Recommend stopping fluoxetine due to side effects.  Recommend starting sertraline 25 mg daily   Osteopenia   Last DEXA Scan: 09/04/19   T-Score femoral neck: -1.1  T-Score total hip: -0.4  T-Score lumbar spine: +1.1  T-Score forearm radius: n/a  10-year probability of major osteoporotic fracture: n/a  10-year probability of hip fracture: n/a  VITD  Date Value Ref Range Status  06/17/2019 58.41 30.00 - 100.00 ng/mL Final    Patient is not a candidate for pharmacologic treatment  Patient has failed these meds in past: n/a Patient is currently controlled on the following medications:  Vitamin D3 5000 units daily  We discussed:  Recommend [phone] units of vitamin D daily. Recommend 1200 mg of calcium daily from dietary and supplemental sources.  Plan  Recommend stopping Vitamin D Recommend starting Calcium 600 +D3 mg twice daily   Misc / OTC    Aspirin 81 mg daily   Bumetanide 1 mg daily -Edema   Ropinirole 1 mg daily -Restless Legs (QHS)   Ibuprofen 400 mg   Alleve 220 mg   Plan  Continue current medications  GERD   Patient denies dysphagia, heartburn or nausea. Expresses understanding to avoid triggers such as citrus juices, large meals and lying down after eating.  Currently controlled on:  Pepcid 20 mg daily   Pepto-Bismol PRN   Plan   Continue current medication.  Vaccines   Reviewed and discussed patient's vaccination history.    Immunization History  Administered Date(s) Administered   Fluad Quad(high Dose 65+) 09/22/2019   PFIZER SARS-COV-2 Vaccination 01/10/2020, 02/03/2020   Pneumococcal Conjugate-13 09/22/2019   Tdap 12/03/2012   Medication Management    Pt uses [facility] for all medications Uses pill box? Yes  We discussed: Discussed benefits of medication synchronization, packaging and delivery as well as enhanced pharmacist oversight with [facility].  Plan  Utilize [facility] pharmacy for medication synchronization, packaging and delivery  Verbal consent obtained for [facility] enhanced pharmacy services (medication synchronization, adherence packaging, delivery coordination). A medication sync plan was created to allow patient to get all medications delivered once every 30 to 90 days per patient preference. Patient understands they have freedom to choose pharmacy and clinical pharmacist will coordinate care between all prescribers and [facility].  Follow up: 6 month phone visit  Garey Ham Clinical Pharmacist [facility] at [facility]  [phone]

## 2020-08-25 NOTE — Patient Instructions (Signed)
Visit Information It was great speaking with you today!  Please let me know if you have any questions about our visit.  Plan:  . I will speak with Dr. Barron Alvine about the referrals and medication changes and let you know what we discuss. . We plan to use [facility] going forward for your medications  Garey Ham Clinical Pharmacist [facility] at [facility]  [phone]

## 2020-08-29 NOTE — Addendum Note (Signed)
Addended by: Rene Paci on: 08/29/2020 04:29 PM   Modules accepted: Orders

## 2020-08-31 ENCOUNTER — Other Ambulatory Visit: Payer: Self-pay | Admitting: Family Medicine

## 2020-08-31 ENCOUNTER — Telehealth: Payer: Self-pay

## 2020-08-31 DIAGNOSIS — F322 Major depressive disorder, single episode, severe without psychotic features: Secondary | ICD-10-CM

## 2020-08-31 DIAGNOSIS — M79671 Pain in right foot: Secondary | ICD-10-CM

## 2020-08-31 MED ORDER — SERTRALINE HCL 25 MG PO TABS: ORAL_TABLET | ORAL | 2 refills | Status: DC

## 2020-08-31 NOTE — Progress Notes (Signed)
Completed patient on boarding form to recevice medication from [facility].  Everlean Cherry Clinical Pharmacist Assistant [phone]

## 2020-09-01 ENCOUNTER — Telehealth: Payer: Self-pay | Admitting: Internal Medicine

## 2020-09-01 ENCOUNTER — Telehealth: Payer: Self-pay

## 2020-09-01 MED ORDER — ALBUTEROL SULFATE HFA 108 (90 BASE) MCG/ACT IN AERS: 2.0000 | INHALATION_SPRAY | Freq: Four times a day (QID) | RESPIRATORY_TRACT | 0 refills | Status: AC | PRN

## 2020-09-01 NOTE — Telephone Encounter (Signed)
Rx for pt's albuterol inhaler has been sent to pharmacy for pt. Pt needs appt prior to further refills. This has been stated on the Rx. Nothing further needed.

## 2020-09-01 NOTE — Progress Notes (Signed)
Please reach out to Ms. Hassing and relay the following plan to her:  - Dr. Barron Alvine placed a referral to [facility]. She can call to schedule an appointment with them. - After discussion, we decided to try switching her fluoxetine to sertraline. We will deliver it for her tomorrow, and then she can plan to stop fluoxetine, and start sertraline 25 mg daily. After one week she can increase sertraline to 50 mg (2 tablets daily)  -She should continue to try and get regular exercise and it would likely be beneficial to to speak with a behavioral specialist. If she is interested, here are some resources she can look into.    [facility]:  [email or web address]   [facility]  [email or web address]   [facility]  [email or web address]   [facility]  [email or web address]   [email or web address]  Thanks, Garey Ham Clinical Pharmacist [facility] at [facility]  [phone]

## 2020-09-01 NOTE — Progress Notes (Signed)
Spoke to patient to inform her per Angelena Sole the clinical pharmacist  :   - Dr. Barron Alvine (PCP) placed a referral to [facility]. She can call to schedule an appointment with them. - After discussion, They decided to try switching her fluoxetine to sertraline. We will deliver it for her tomorrow, and then she can plan to stop fluoxetine, and start sertraline 25 mg daily. After one week she can increase sertraline to 50 mg (2 tablets daily)  -She should continue to try and get regular exercise and it would likely be beneficial to speak with a behavioral specialist. If she is interested, here are some resources she can look into.    [facility]:  [email or web address]   [facility]  [email or web address]   [facility]  [email or web address]   [facility]  [email or web address]   [email or web address]   Patient states she does not want to start sertraline because she was taking it in the past and cause her to feel like "zombie".Patient states she wants to start Wellbutrin.  Per Angelena Sole after discussion with PCP they decided against it because it can actually worsen anxiety symptoms.  Patient states she will continue taking fluoxetine as of now.  Schedule patient a office visit with Angelena Sole the clinical pharmacist on 09/19/2020 at 3:00 pm.  Patient verbalized  Understanding.  Everlean Cherry Clinical Pharmacist Assistant [phone]

## 2020-09-01 NOTE — Progress Notes (Signed)
Agree completely! We will reach out and let her know.   Thanks!  Garey Ham Clinical Pharmacist  Primary Care at [facility]  [phone]

## 2020-09-06 ENCOUNTER — Other Ambulatory Visit: Payer: Self-pay | Admitting: Family Medicine

## 2020-09-06 ENCOUNTER — Telehealth: Payer: Self-pay

## 2020-09-06 DIAGNOSIS — F419 Anxiety disorder, unspecified: Secondary | ICD-10-CM

## 2020-09-06 DIAGNOSIS — F322 Major depressive disorder, single episode, severe without psychotic features: Secondary | ICD-10-CM

## 2020-09-06 DIAGNOSIS — G2581 Restless legs syndrome: Secondary | ICD-10-CM

## 2020-09-06 MED ORDER — ROPINIROLE HCL 1 MG PO TABS: 1.0000 mg | ORAL_TABLET | Freq: Every day | ORAL | 3 refills | Status: AC

## 2020-09-06 MED ORDER — FLUOXETINE HCL 20 MG PO TABS: 20.0000 mg | ORAL_TABLET | Freq: Every day | ORAL | 0 refills | Status: DC

## 2020-09-06 NOTE — Telephone Encounter (Signed)
PA started for Fluoxetine per [facility].  PA key: [id number]

## 2020-09-07 NOTE — Telephone Encounter (Signed)
PA was approved for Fluoxetine, pharmacy and pt notified.  PA Key-[id number]

## 2020-09-08 ENCOUNTER — Encounter: Payer: Self-pay | Admitting: Family Medicine

## 2020-09-08 ENCOUNTER — Telehealth (INDEPENDENT_AMBULATORY_CARE_PROVIDER_SITE_OTHER): Payer: Medicare Other | Admitting: Family Medicine

## 2020-09-08 VITALS — Ht 60.0 in | Wt 200.0 lb

## 2020-09-08 DIAGNOSIS — F419 Anxiety disorder, unspecified: Secondary | ICD-10-CM | POA: Diagnosis not present

## 2020-09-08 DIAGNOSIS — F331 Major depressive disorder, recurrent, moderate: Secondary | ICD-10-CM | POA: Diagnosis not present

## 2020-09-08 NOTE — Progress Notes (Signed)
Virtual Visit via Video Note  I connected with [name] on 09/08/20 at  8:00 AM EDT by a video enabled telemedicine application and verified that I am speaking with the correct person using two identifiers. Location patient: home Location provider: work  Persons participating in the virtual visit: patient, provider  I discussed the limitations of evaluation and management by telemedicine and the availability of in person appointments. The patient expressed understanding and agreed to proceed.  Chief Complaint  Patient presents with  . Follow-up    1 month f/u anxiety    HPI: Peggy Dixon is a 69 y.o. female seen today in f/u on anxiety and depression. Pt had spoken with our clinical pharmacist, Alex, and was complaining about gaining weight , lack of motivation. She thinks her prozac has contributed to weight gain.  Pt has been walking but is limited at times by her breathing. She is following Optiva diet and feels it works very well for her.  She states she had gained weight on zoloft in the past She feels like her anxiety is ok, overall controlled.   Past Medical History:  Diagnosis Date  . COPD (chronic obstructive pulmonary disease) (HCC)   . OSA (obstructive sleep apnea)     Past Surgical History:  Procedure Laterality Date  . ABDOMINAL HYSTERECTOMY    . BREAST BIOPSY Left   . BREAST REDUCTION SURGERY    . eye surgery right     . REDUCTION MAMMAPLASTY    . right knee replacement      Family History  Problem Relation Age of Onset  . Emphysema Mother   . Heart attack Mother   . Heart attack Father   . Multiple sclerosis Father     Social History   Tobacco Use  . Smoking status: Former Smoker    Packs/day: 0.50    Types: Cigarettes    Quit date: 12/04/1995    Years since quitting: 24.7  . Smokeless tobacco: Never Used  Substance Use Topics  . Alcohol use: Yes    Comment: couple drinks/month  . Drug use: Never     Current Outpatient Medications:  .   albuterol (VENTOLIN HFA) 108 (90 Base) MCG/ACT inhaler, Inhale 2 puffs into the lungs every 6 (six) hours as needed for wheezing or shortness of breath. Pt needs appt prior to further refills., Disp: 8 g, Rfl: 0 .  aspirin EC 81 MG tablet, Take 81 mg by mouth daily., Disp: , Rfl:  .  bismuth subsalicylate (PEPTO BISMOL) 262 MG/15ML suspension, Take 30 mLs by mouth every 6 (six) hours as needed., Disp: , Rfl:  .  bumetanide (BUMEX) 1 MG tablet, Take 1 tablet (1 mg total) by mouth daily., Disp: 90 tablet, Rfl: 3 .  cholecalciferol (VITAMIN D3) 25 MCG (1000 UT) tablet, Take 1,000 Units by mouth daily. Pt states she takes 5,000 mg daily, Disp: , Rfl:  .  famotidine (PEPCID) 20 MG tablet, Take 20 mg by mouth at bedtime., Disp: , Rfl:  .  FLUoxetine (PROZAC) 20 MG tablet, Take 1 tablet (20 mg total) by mouth daily., Disp: 90 tablet, Rfl: 0 .  ibuprofen (ADVIL) 200 MG tablet, Take 400 mg by mouth every 6 (six) hours as needed., Disp: , Rfl:  .  naproxen sodium (ALEVE) 220 MG tablet, Take 220 mg by mouth daily as needed., Disp: , Rfl:  .  rOPINIRole (REQUIP) 1 MG tablet, Take 1 tablet (1 mg total) by mouth daily., Disp: 90 tablet, Rfl: 3  No Known Allergies    ROS: See pertinent positives and negatives per HPI.   EXAM:  VITALS per patient if applicable: There were no vitals taken for this visit.   GENERAL: alert, oriented, appears well and in no acute distress  HEENT: atraumatic, conjunctiva clear, no obvious abnormalities on inspection of external nose and ears  NECK: normal movements of the head and neck  LUNGS: on inspection no signs of respiratory distress, breathing rate appears normal, no obvious gross SOB, gasping or wheezing, no conversational dyspnea  CV: no obvious cyanosis  MS: moves all visible extremities without noticeable abnormality  PSYCH/NEURO: pleasant and cooperative, no obvious depression or anxiety, speech and thought processing grossly intact   ASSESSMENT AND  PLAN:  1. Moderate episode of recurrent major depressive disorder (HCC) 2. Anxiety - pt feels symptoms are controlled on prozac - she is sleeping better, worrying less, etc - she notes weight gain and is trying to lose with Optiva diet and walking, some days she does better than others sticking to this. She does not want to switch to a different med for depression and anxiety d/t symptoms being well-controlled. She will cont with diet and recommended increased walking with goal of 11min/wk.    I discussed the assessment and treatment plan with the patient. The patient was provided an opportunity to ask questions and all were answered. The patient agreed with the plan and demonstrated an understanding of the instructions.   The patient was advised to call back or seek an in-person evaluation if the symptoms worsen or if the condition fails to improve as anticipated.   [name], DO

## 2020-09-13 ENCOUNTER — Telehealth: Payer: Self-pay

## 2020-09-13 NOTE — Progress Notes (Signed)
@     Chronic Care Management Pharmacy Assistant   Name: Peggy Dixon  MRN: [id number] DOB: 1951-09-18  Reason for Encounter: Medication Review   PCP : Overton Mam, DO  Allergies:  No Known Allergies  Medications: Outpatient Encounter Medications as of 09/13/2020  Medication Sig  . albuterol (VENTOLIN HFA) 108 (90 Base) MCG/ACT inhaler Inhale 2 puffs into the lungs every 6 (six) hours as needed for wheezing or shortness of breath. Pt needs appt prior to further refills.  Marland Kitchen aspirin EC 81 MG tablet Take 81 mg by mouth daily.  Marland Kitchen bismuth subsalicylate (PEPTO BISMOL) 262 MG/15ML suspension Take 30 mLs by mouth every 6 (six) hours as needed.  . bumetanide (BUMEX) 1 MG tablet Take 1 tablet (1 mg total) by mouth daily.  . cholecalciferol (VITAMIN D3) 25 MCG (1000 UT) tablet Take 1,000 Units by mouth daily. Pt states she takes 5,000 mg daily  . famotidine (PEPCID) 20 MG tablet Take 20 mg by mouth at bedtime.  Marland Kitchen FLUoxetine (PROZAC) 20 MG tablet Take 1 tablet (20 mg total) by mouth daily.  Marland Kitchen ibuprofen (ADVIL) 200 MG tablet Take 400 mg by mouth every 6 (six) hours as needed.  . naproxen sodium (ALEVE) 220 MG tablet Take 220 mg by mouth daily as needed.  Marland Kitchen rOPINIRole (REQUIP) 1 MG tablet Take 1 tablet (1 mg total) by mouth daily.   No facility-administered encounter medications on file as of 09/13/2020.    Current Diagnosis: Patient Active Problem List   Diagnosis Date Noted  . Obstructive sleep apnea 02/26/2019  . COPD mixed type (HCC) 02/26/2019     Follow-Up:  Pharmacist Review   Reviewed chart for medication changes ahead of medication coordination call.  No OVs, Consults, or hospital visits since last care coordination call/Pharmacist visit.  No medication changes indicated.  BP Readings from Last 3 Encounters:  08/16/20 118/74  01/13/20 123/85  06/12/19 120/80    No results found for: HGBA1C   Patient obtains medications through Adherence Packaging  30 Days    Last adherence delivery included: (medication name and frequency)  None ID Patient declined (meds) last month due to PRN use/additional supply on hand.  None ID  Patient is due for next adherence delivery on: 09/28/2020. Called patient and reviewed medications and coordinated delivery.  This delivery to include:  Bumetanide 1 MG Tablet Daily Breakfast (patient has 14 on hand)  Calcium 600 + Vitamin D Twice Daily- Breakfast, Dinner (patient has 23 on hand).  Patient declined the following medications (meds) due to (reason)  Ropinirole 1 MG Tablet Daily- bedtime (patient has adequate supply from [facility] , 81 on hand.)  Fluoxetine 20 MG Tablet Daily -bedtime- (patient has adequate supply from [facility] , 71 on hand.)  Albuterol-Patient states she needs a doctor appointment first.  Aspirin 81 MG Tablet Daily-adequate supply.  Patient needs refills for None ID.  Confirmed delivery date of 09/28/2020, advised patient that pharmacy will contact them the morning of delivery.  Everlean Cherry Clinical Pharmacist Assistant [phone]

## 2020-09-14 ENCOUNTER — Ambulatory Visit: Payer: Medicare Other | Admitting: Podiatry

## 2020-09-14 ENCOUNTER — Encounter: Payer: Self-pay | Admitting: Podiatry

## 2020-09-14 ENCOUNTER — Other Ambulatory Visit: Payer: Self-pay

## 2020-09-14 ENCOUNTER — Ambulatory Visit: Payer: Medicare Other

## 2020-09-14 DIAGNOSIS — M7752 Other enthesopathy of left foot: Secondary | ICD-10-CM

## 2020-09-14 DIAGNOSIS — M7671 Peroneal tendinitis, right leg: Secondary | ICD-10-CM | POA: Diagnosis not present

## 2020-09-14 DIAGNOSIS — M7750 Other enthesopathy of unspecified foot: Secondary | ICD-10-CM

## 2020-09-14 NOTE — Progress Notes (Signed)
Subjective:   Patient ID: Peggy Dixon, female   DOB: 69 y.o.   MRN: [id number]   HPI Patient presents stating she has pain on the inside of the right arch and on the outside and states they are both hurting her a lot and the inside is been present for at least 6 months in the outside for several months.  States is been sore and making it hard to walk and patient does smoke half a pack per day but no longer smokes and likes to be active   Review of Systems  All other systems reviewed and are negative.       Objective:  Physical Exam Vitals and nursing note reviewed.  Constitutional:      Appearance: She is well-developed.  Pulmonary:     Effort: Pulmonary effort is normal.  Musculoskeletal:        General: Normal range of motion.  Skin:    General: Skin is warm.  Neurological:     Mental Status: She is alert.     Neurovascular status intact muscle strength adequate range of motion within normal limits with exquisite discomfort posterior tibial insertion right and peroneal insertion right which is probably compensatory but painful.  Patient did not have any muscle strength loss or other pathology and has good digital perfusion well oriented x3     Assessment:  Acute posterior tibial tendinitis right and peroneal tendinitis right with both areas inflamed no indications of muscle damage or other pathology     Plan:  We reviewed conditions and I went ahead did sterile prep and injected the posterior tibial insertion 3 mg Dexasone Kenalog 5 g liken in the peroneal insertion 3 mg dexamethasone Kenalog 5 mg Xylocaine applied fascial brace instructed on supportive shoes and reappoint and may require immobilization or other treatment depending on response  X-rays indicate that there is no signs of complete collapse of the arch and these were brought with her we may need to get weightbearing x-rays depending on how she is doing

## 2020-09-16 DIAGNOSIS — J449 Chronic obstructive pulmonary disease, unspecified: Secondary | ICD-10-CM | POA: Diagnosis not present

## 2020-09-19 ENCOUNTER — Ambulatory Visit: Payer: Medicare Other

## 2020-09-21 ENCOUNTER — Other Ambulatory Visit: Payer: Self-pay

## 2020-09-21 ENCOUNTER — Encounter: Payer: Self-pay | Admitting: Podiatry

## 2020-09-21 ENCOUNTER — Ambulatory Visit: Payer: Medicare Other | Admitting: Podiatry

## 2020-09-21 DIAGNOSIS — M7750 Other enthesopathy of unspecified foot: Secondary | ICD-10-CM | POA: Diagnosis not present

## 2020-09-21 DIAGNOSIS — M7671 Peroneal tendinitis, right leg: Secondary | ICD-10-CM

## 2020-09-21 NOTE — Progress Notes (Signed)
Subjective:   Patient ID: Peggy Dixon, female   DOB: 69 y.o.   MRN: [id number]   HPI Patient states she is improving and states pain is about 80% better   ROS      Objective:  Physical Exam  Neurovascular status intact with patient's right arch and heel improved with pain still present only upon deep palpation     Assessment:  Improvement of acute plantar fasciitis right     Plan:  Advised on continued physical therapy anti-inflammatory topical medicine as needed and oral medication as needed.  Patient is discharged and will be seen back if symptoms indicate

## 2020-09-28 NOTE — Progress Notes (Signed)
Outcome Approvedon October 5 Request Reference Number: [id number]. FLUOXETINE TAB 20MG  is approved through 12/02/2021. Your patient may now fill this prescription and it will be covered.

## 2020-10-08 IMAGING — DX CHEST - 2 VIEW
2 series · 2 of 2 positions shown · non-contrast
Comparison: None.

CLINICAL DATA: COPD mixed type.

EXAM:
CHEST - 2 VIEW

[chest pa]
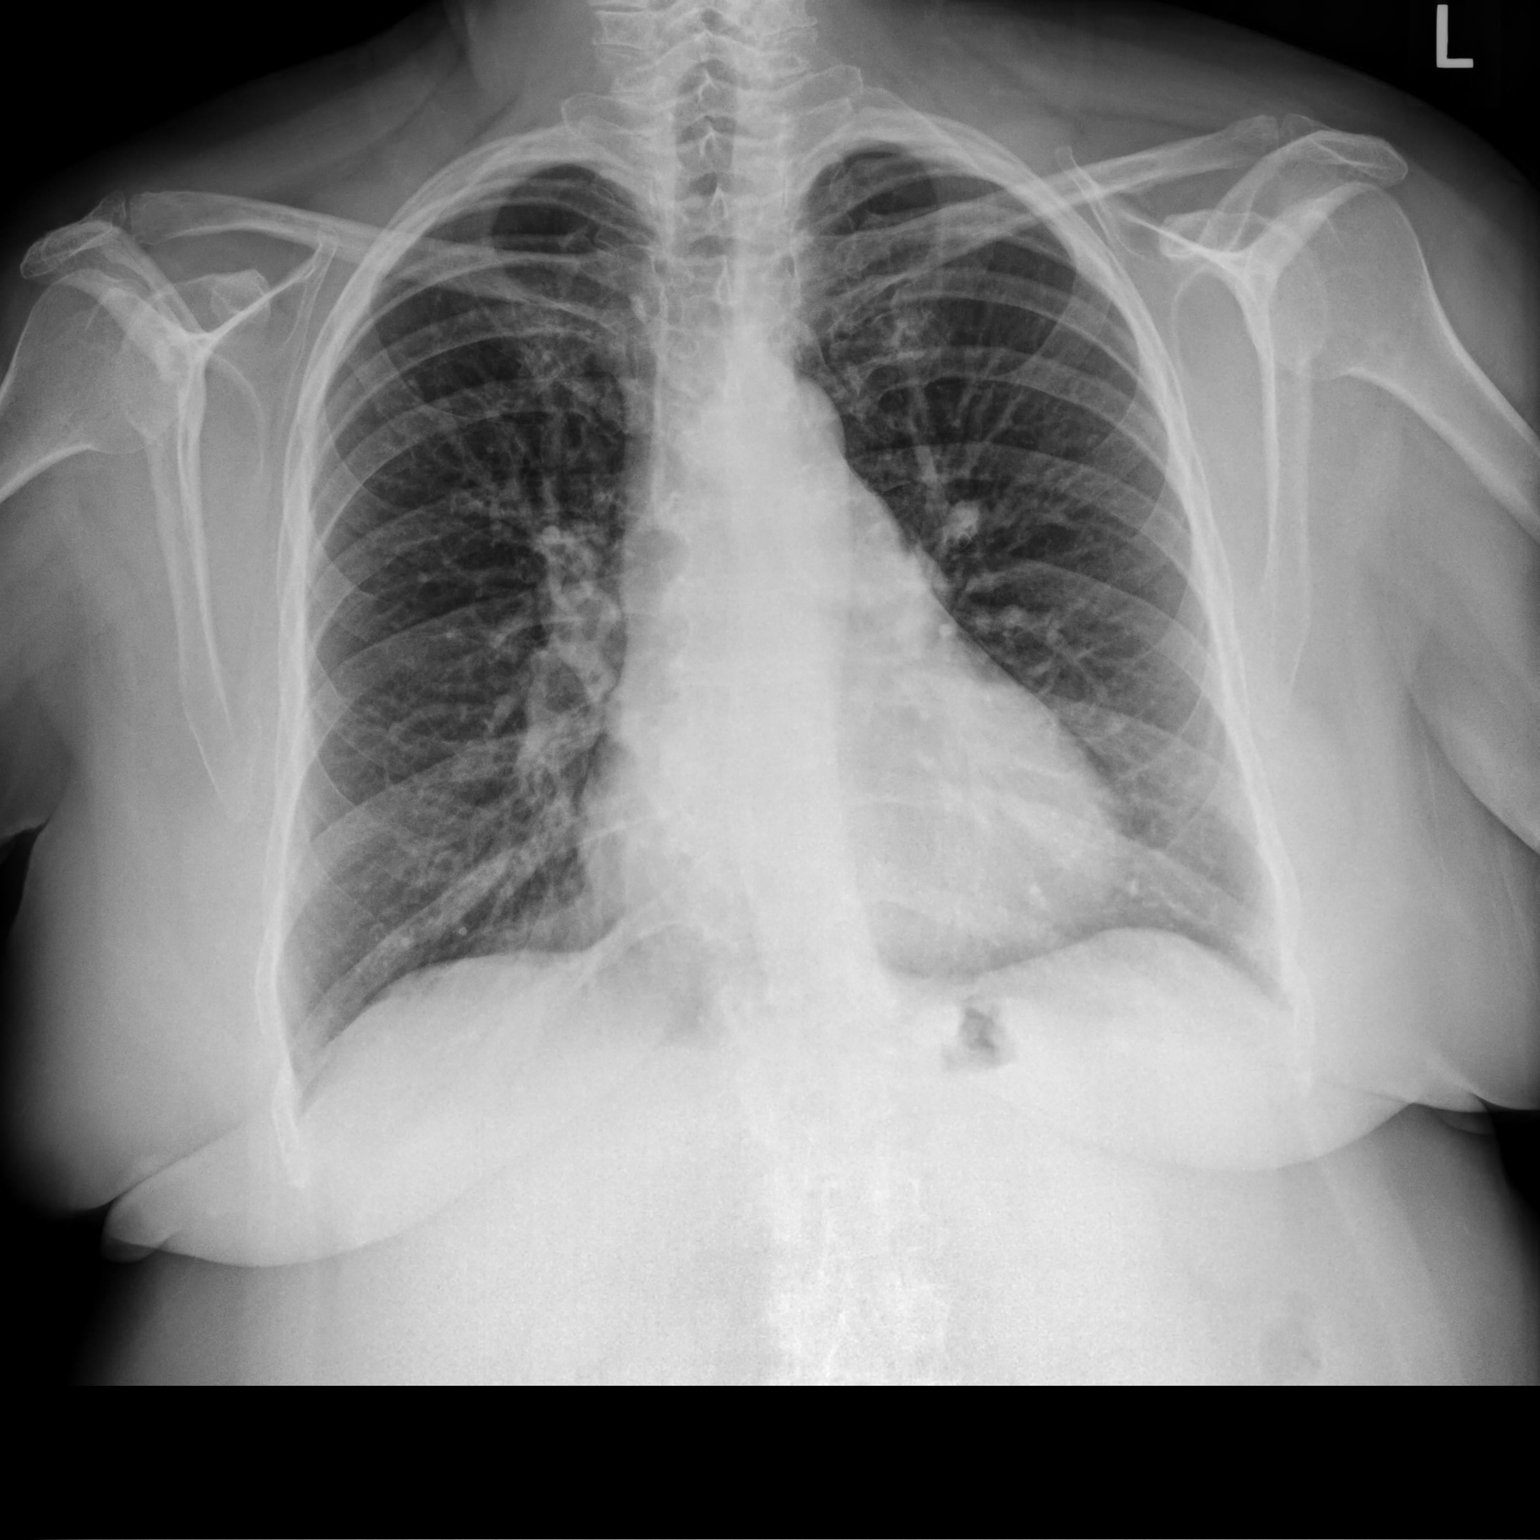

[chest lat]
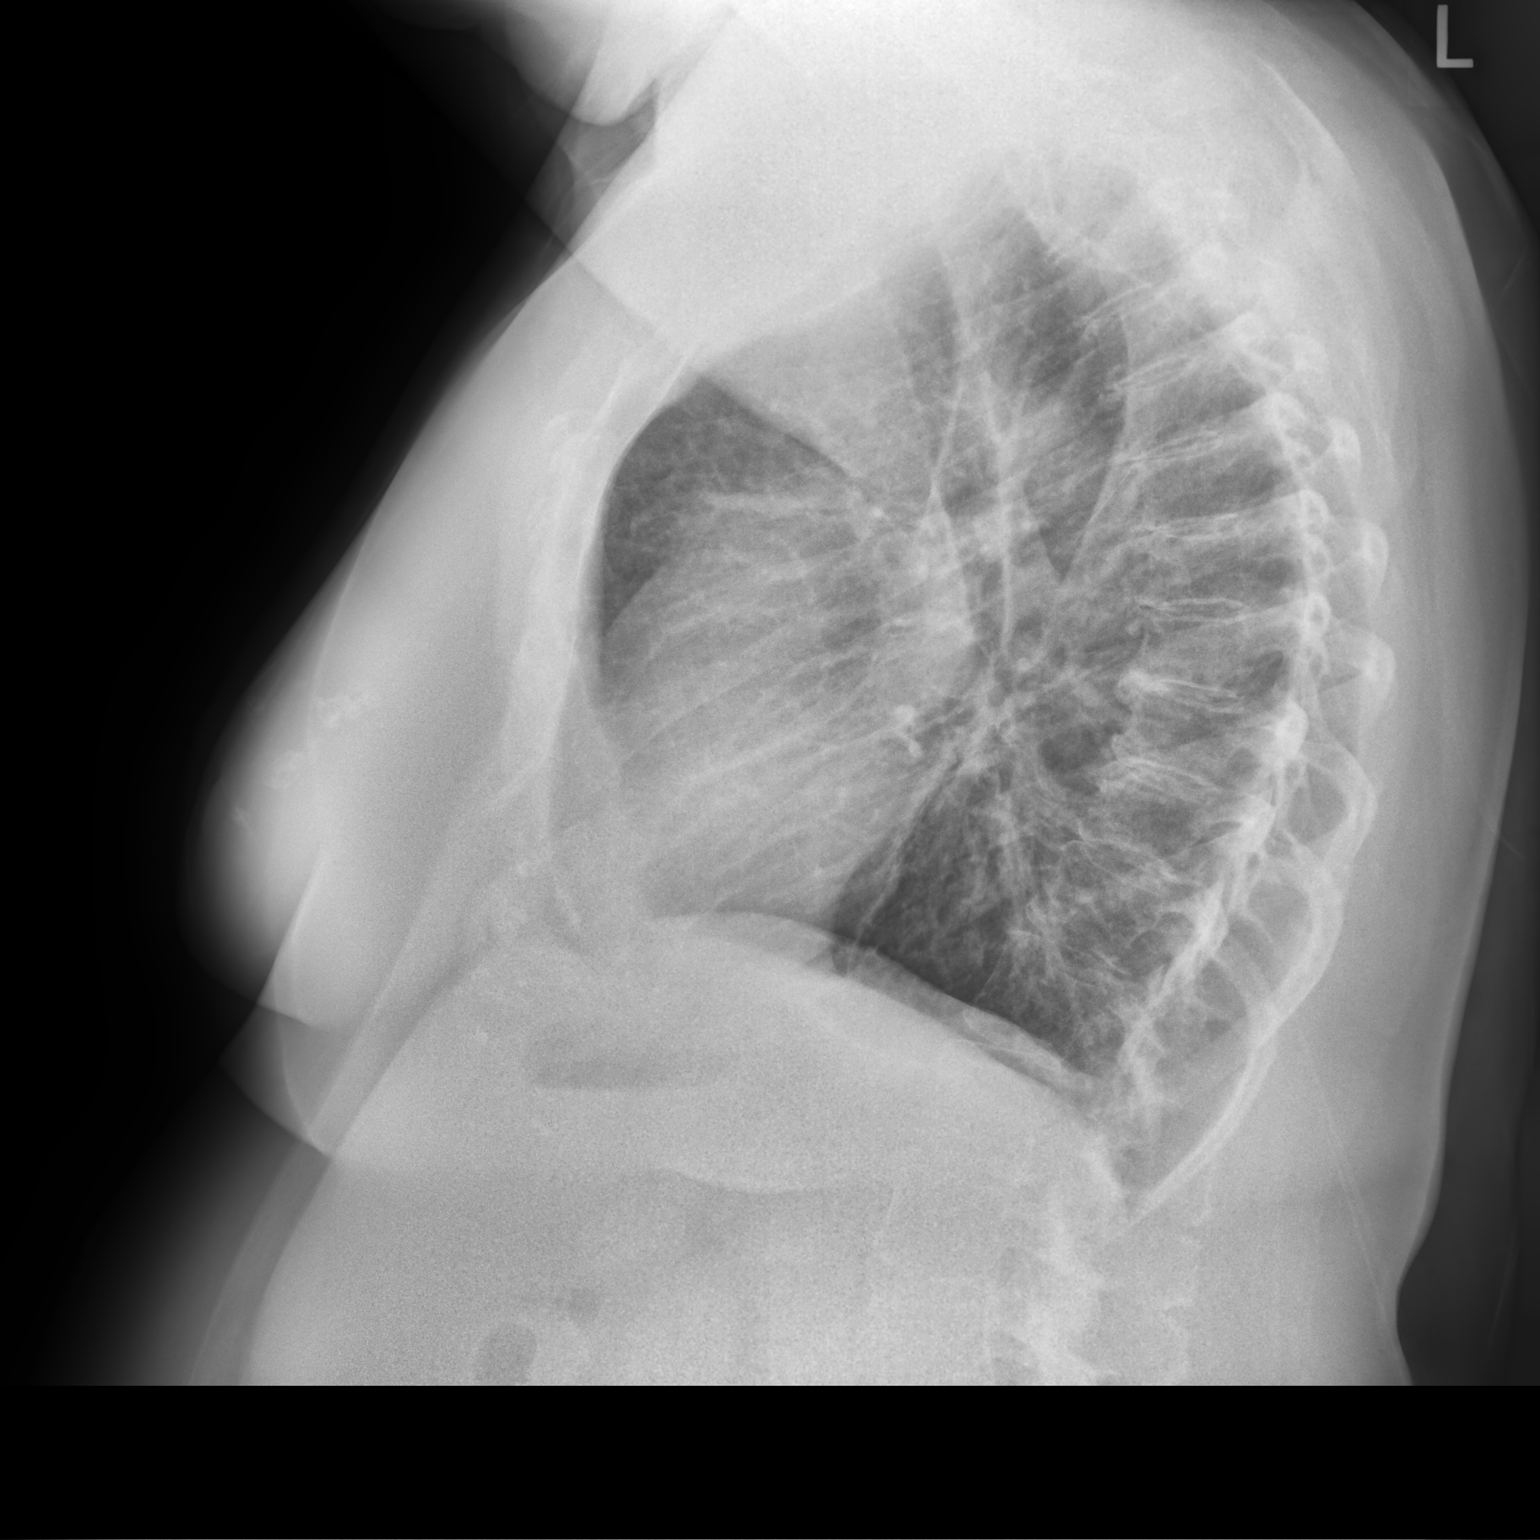

[2 of 2 positions shown; findings below may reference images not displayed]

FINDINGS: Slightly prominent lung markings without focal airspace disease or
pulmonary edema. Hemidiaphragms are slightly flattened on the
lateral view and there is slight prominence of the retrosternal
space on the lateral view. Findings suggest mild enlargement of the
lung volumes. Heart and mediastinum are within normal limits.
Trachea is midline. No pleural effusions. Degenerative endplate
changes in the thoracic spine.
IMPRESSION: Mild hyperinflation without acute findings.

## 2020-10-17 DIAGNOSIS — J449 Chronic obstructive pulmonary disease, unspecified: Secondary | ICD-10-CM | POA: Diagnosis not present

## 2020-10-18 ENCOUNTER — Telehealth: Payer: Self-pay

## 2020-10-18 NOTE — Progress Notes (Signed)
Chronic Care Management Pharmacy Assistant   Name: Peggy Dixon  MRN: [id number] DOB: [birth year]  Reason for Encounter: Medication Review  PCP : Overton Mam, DO  Allergies:  No Known Allergies  Medications: Outpatient Encounter Medications as of 10/18/2020  Medication Sig  . albuterol (VENTOLIN HFA) 108 (90 Base) MCG/ACT inhaler Inhale 2 puffs into the lungs every 6 (six) hours as needed for wheezing or shortness of breath. Pt needs appt prior to further refills.  Marland Kitchen aspirin EC 81 MG tablet Take 81 mg by mouth daily.  Marland Kitchen bismuth subsalicylate (PEPTO BISMOL) 262 MG/15ML suspension Take 30 mLs by mouth every 6 (six) hours as needed.  . bumetanide (BUMEX) 1 MG tablet Take 1 tablet (1 mg total) by mouth daily.  . cholecalciferol (VITAMIN D3) 25 MCG (1000 UT) tablet Take 1,000 Units by mouth daily. Pt states she takes 5,000 mg daily  . famotidine (PEPCID) 20 MG tablet Take 20 mg by mouth at bedtime.  Marland Kitchen FLUoxetine (PROZAC) 20 MG tablet Take 1 tablet (20 mg total) by mouth daily.  Marland Kitchen ibuprofen (ADVIL) 200 MG tablet Take 400 mg by mouth every 6 (six) hours as needed.  . naproxen sodium (ALEVE) 220 MG tablet Take 220 mg by mouth daily as needed.  Marland Kitchen rOPINIRole (REQUIP) 1 MG tablet Take 1 tablet (1 mg total) by mouth daily.   No facility-administered encounter medications on file as of 10/18/2020.    Current Diagnosis: Patient Active Problem List   Diagnosis Date Noted  . Obstructive sleep apnea 02/26/2019  . COPD mixed type (HCC) 02/26/2019   Follow-Up:  Pharmacist Review   Reviewed chart for medication changes ahead of medication coordination call.  No OVs, Consults, or hospital visits since last care coordination call/Pharmacist visit.   09/14/2020 Podiatry [facility]  09/21/2020 Podiatry [facility]   No medication changes indicated.  BP Readings from Last 3 Encounters:  08/16/20 118/74  01/13/20 123/85  06/12/19 120/80    No results found for: HGBA1C    Patient obtains medications through Adherence Packaging  30 Days   Last adherence delivery included: (medication name and frequency)    Bumetanide 1 MG Tablet Daily Breakfast (patient has 14 on hand)             Calcium 600 + Vitamin D Twice Daily- Breakfast, Dinner (patient has 23 on hand).  Patient declined the following medications (meds) due to (reason)             Ropinirole 1 MG Tablet Daily- bedtime (patient has adequate supply from [facility] , 81 on hand.)             Fluoxetine 20 MG Tablet Daily -bedtime- (patient has adequate supply from [facility] , 71 on hand.)             Albuterol-Patient states she needs a doctor appointment first.             Aspirin 81 MG Tablet Daily-adequate supply.  Patient declined (meds) last month due to PRN use/additional supply on hand.  None ID  Patient is due for next adherence delivery on: No Fill needed. Called patient and reviewed medications and coordinated delivery.  This delivery to include: None ID   Patient declined the following medications (meds) due to (reason)     Ropinirole 1 MG Tablet Daily- bedtime (patient has adequate supply)             Fluoxetine 20 MG Tablet Daily -bedtime- (patient has adequate supply)  Albuterol-Patient states she needs a doctor appointment first.             Aspirin 81 MG Tablet Daily-adequate supply.    Bumetanide 1 MG Tablet Daily Breakfast (patient has adequate supply)             Calcium 600 + Vitamin D Twice Daily- Breakfast, Dinner (patient has adequate supply)  Patient needs refills for None ID.  Confirmed delivery date of No Fill needed, advised patient that pharmacy will contact them the morning of delivery.  Everlean Cherry Clinical Pharmacist Assistant [phone]

## 2020-10-22 IMAGING — DX RIGHT HAND - COMPLETE 3+ VIEW
3 series · 3 of 3 positions shown · non-contrast
Comparison: None.

CLINICAL DATA: Right hand pain for months.

EXAM:
RIGHT HAND - COMPLETE 3+ VIEW

[hand pa]
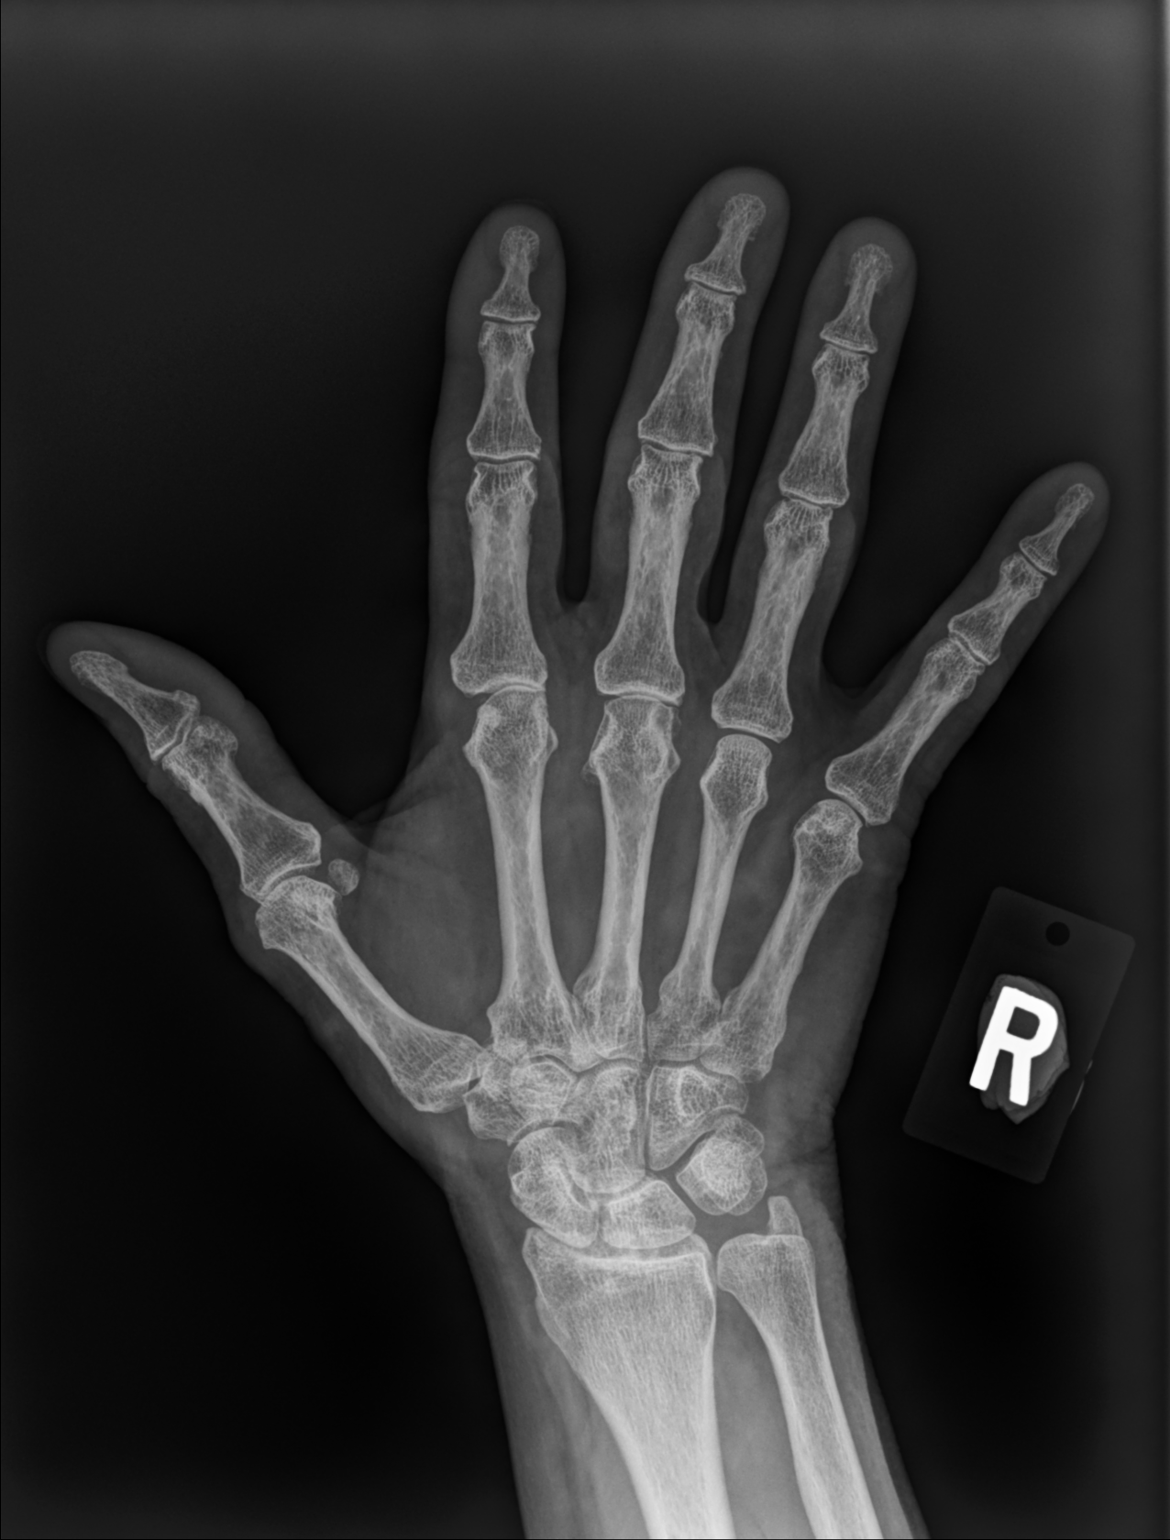

[hand mlo]
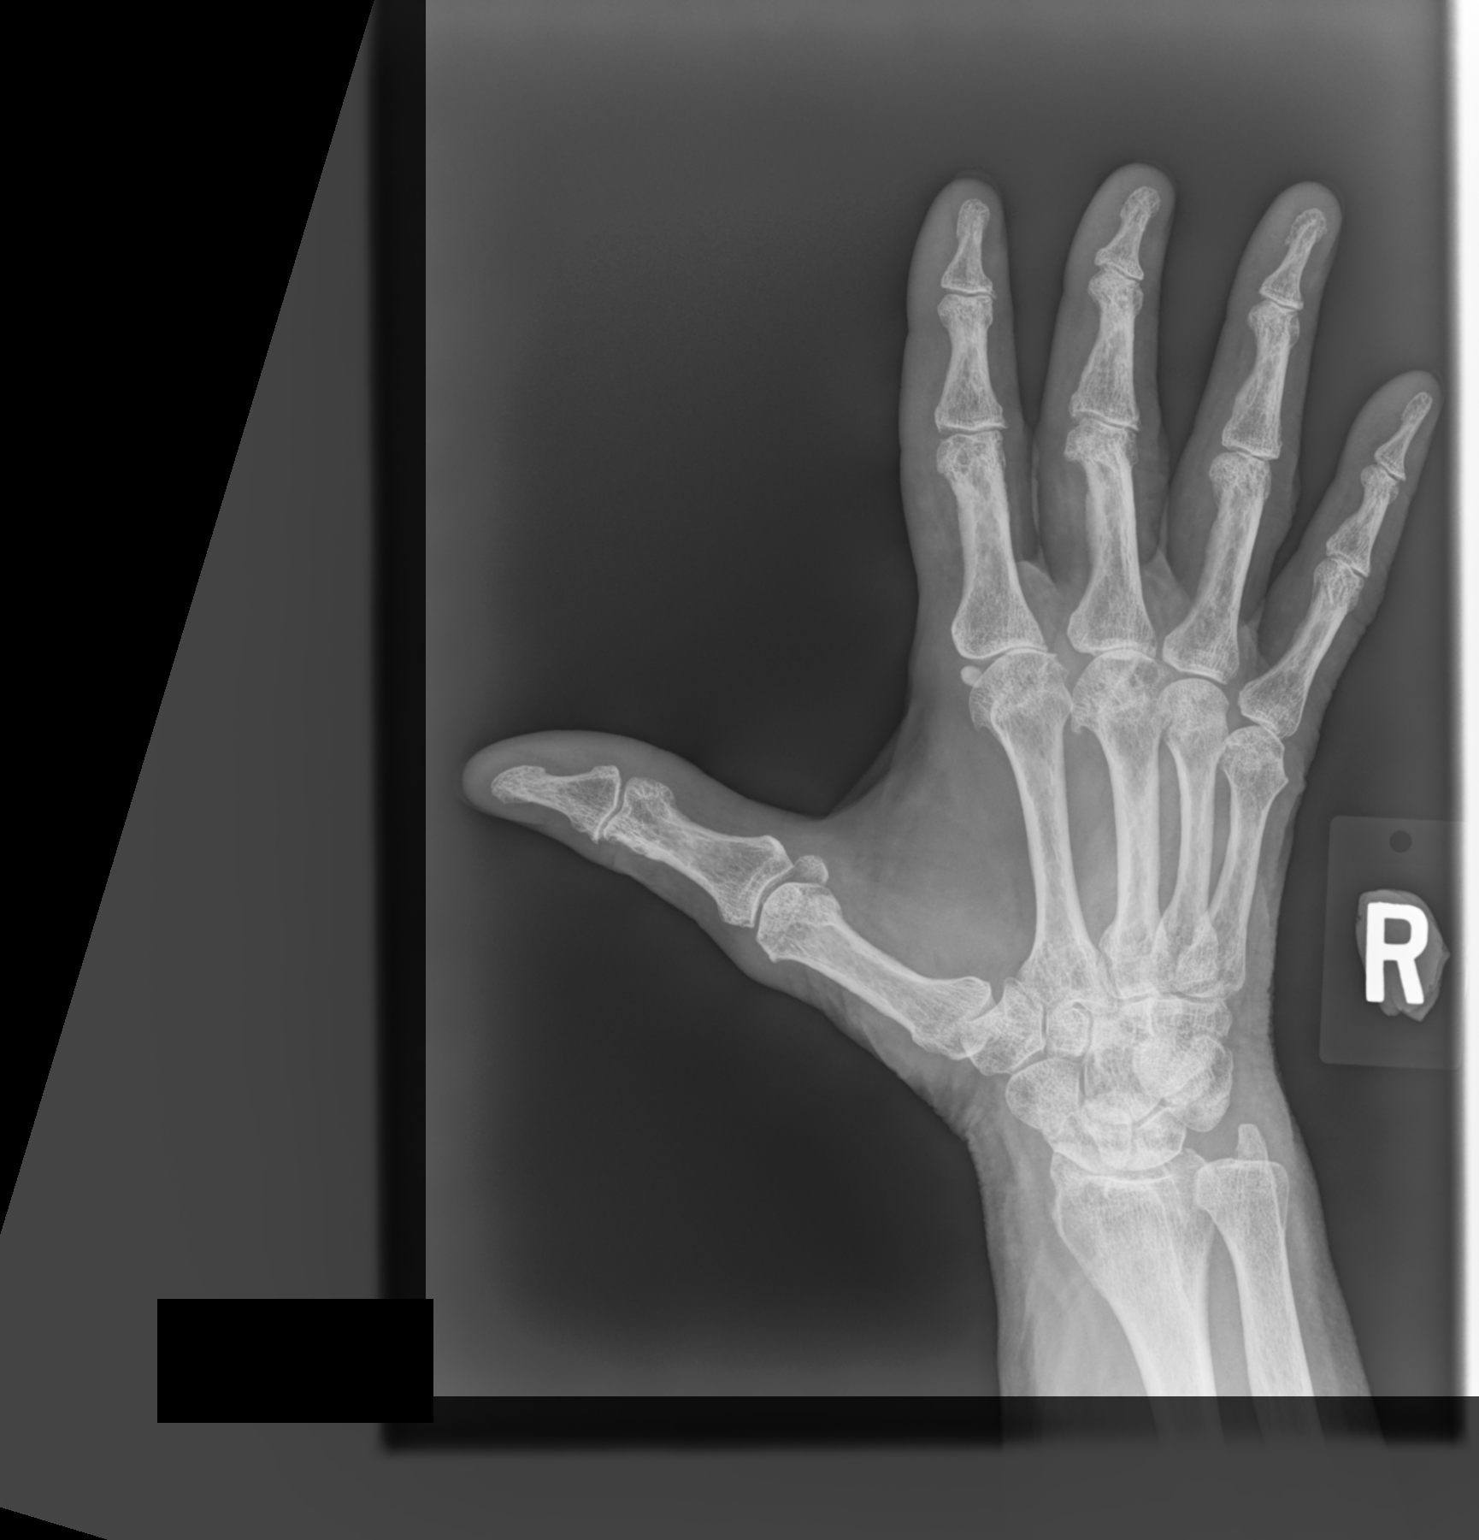

[hand lat]
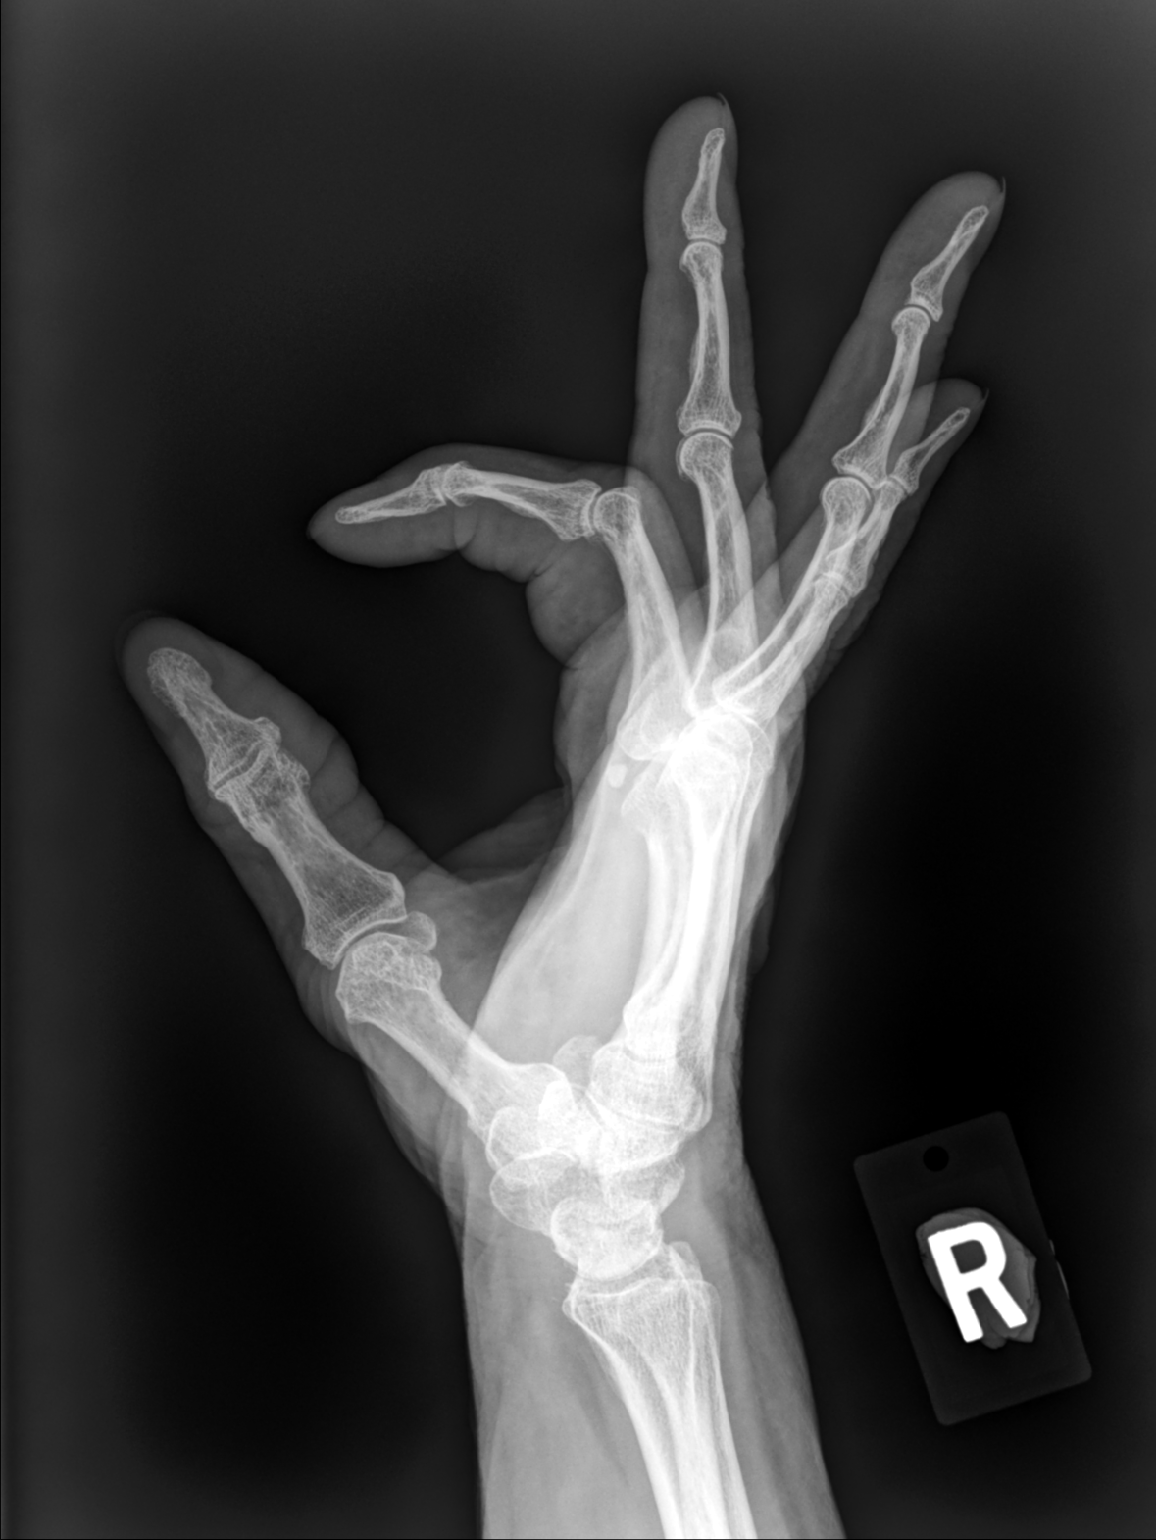

[3 of 3 positions shown; findings below may reference images not displayed]

FINDINGS: There is no evidence of fracture or dislocation. There is no
evidence of arthropathy or other focal bone abnormality. Soft
tissues are unremarkable.
IMPRESSION: Negative.

## 2020-10-24 ENCOUNTER — Telehealth: Payer: Self-pay

## 2020-10-24 NOTE — Progress Notes (Signed)
Patient requesting refills for Bumetanide 1 MG Tablet Daily Breakfast and Calcium 600 + Vitamin D Twice Daily- Breakfast, Dinner.  Informed Clinical Pharmacist of above.  Everlean Cherry Clinical Pharmacist Assistant [phone]

## 2020-11-10 ENCOUNTER — Telehealth: Payer: Self-pay

## 2020-11-10 NOTE — Progress Notes (Signed)
    Chronic Care Management Pharmacy Assistant   Name: Peggy Dixon  MRN: [id number] DOB: [birth year]  Reason for Encounter: Medication Review  PCP : Overton Mam, DO  Allergies:  No Known Allergies  Medications: Outpatient Encounter Medications as of 11/10/2020  Medication Sig  . albuterol (VENTOLIN HFA) 108 (90 Base) MCG/ACT inhaler Inhale 2 puffs into the lungs every 6 (six) hours as needed for wheezing or shortness of breath. Pt needs appt prior to further refills.  Marland Kitchen aspirin EC 81 MG tablet Take 81 mg by mouth daily.  Marland Kitchen bismuth subsalicylate (PEPTO BISMOL) 262 MG/15ML suspension Take 30 mLs by mouth every 6 (six) hours as needed.  . bumetanide (BUMEX) 1 MG tablet Take 1 tablet (1 mg total) by mouth daily.  . cholecalciferol (VITAMIN D3) 25 MCG (1000 UT) tablet Take 1,000 Units by mouth daily. Pt states she takes 5,000 mg daily  . famotidine (PEPCID) 20 MG tablet Take 20 mg by mouth at bedtime.  Marland Kitchen FLUoxetine (PROZAC) 20 MG tablet Take 1 tablet (20 mg total) by mouth daily.  Marland Kitchen ibuprofen (ADVIL) 200 MG tablet Take 400 mg by mouth every 6 (six) hours as needed.  . naproxen sodium (ALEVE) 220 MG tablet Take 220 mg by mouth daily as needed.  Marland Kitchen rOPINIRole (REQUIP) 1 MG tablet Take 1 tablet (1 mg total) by mouth daily.   No facility-administered encounter medications on file as of 11/10/2020.    Current Diagnosis: Patient Active Problem List   Diagnosis Date Noted  . Obstructive sleep apnea 02/26/2019  . COPD mixed type (HCC) 02/26/2019     Follow-Up:  Pharmacist Review   Reviewed chart for medication changes ahead of medication coordination call.  No OVs, Consults, or hospital visits since last care coordination call/Pharmacist visit. No medication changes indicated.  BP Readings from Last 3 Encounters:  08/16/20 118/74  01/13/20 123/85  06/12/19 120/80    No results found for: HGBA1C   Patient obtains medications through Adherence Packaging  30 Days   Last  adherence delivery included: (medication name and frequency)  Bumetanide 1 MG Tablet Daily Breakfast  Calcium 600 + Vitamin D Twice Daily- Breakfast, Dinner. Patient declined (meds) last month due to PRN use/additional supply on hand.  Ropinirole 1 MG Tablet Daily- bedtime (patient has adequate supply) Fluoxetine 20 MG Tablet Daily -bedtime- (patient has adequate supply) Albuterol-Patient states she needs a doctor appointment first. Aspirin 81 MG Tablet Daily-adequate supply.  Patient is due for next adherence delivery on: 11/24/2020. Called patient and reviewed medications and coordinated delivery.  This delivery to include:  Bumetanide 1 MG Tablet Daily Breakfast  Calcium 600 + Vitamin D Twice Daily- Breakfast, Dinner.  Ropinirole 1 MG Tablet Daily- bedtime  Fluoxetine 20 MG Tablet Daily -bedtime  Aspirin 81 MG Tablet Daily  Patient declined the following medications (meds) due to (reason)   Albuterol-Patient states she needs a doctor appointment first.  Patient needs refills for None ID.  Confirmed delivery date of 11/24/2020, advised patient that pharmacy will contact them the morning of delivery.  Everlean Cherry Clinical Pharmacist Assistant [phone]

## 2020-11-16 DIAGNOSIS — J449 Chronic obstructive pulmonary disease, unspecified: Secondary | ICD-10-CM | POA: Diagnosis not present

## 2020-12-16 ENCOUNTER — Telehealth: Payer: Self-pay

## 2020-12-16 NOTE — Progress Notes (Addendum)
Chronic Care Management Pharmacy Assistant   Name: Peggy Dixon  MRN: [id number] DOB: August 02, 1951  Reason for Encounter: Medication Review   PCP : Overton Mam, DO  Allergies:  No Known Allergies  Medications: Outpatient Encounter Medications as of 12/16/2020  Medication Sig   albuterol (VENTOLIN HFA) 108 (90 Base) MCG/ACT inhaler Inhale 2 puffs into the lungs every 6 (six) hours as needed for wheezing or shortness of breath. Pt needs appt prior to further refills.   aspirin EC 81 MG tablet Take 81 mg by mouth daily.   bismuth subsalicylate (PEPTO BISMOL) 262 MG/15ML suspension Take 30 mLs by mouth every 6 (six) hours as needed.   bumetanide (BUMEX) 1 MG tablet Take 1 tablet (1 mg total) by mouth daily.   cholecalciferol (VITAMIN D3) 25 MCG (1000 UT) tablet Take 1,000 Units by mouth daily. Pt states she takes 5,000 mg daily   famotidine (PEPCID) 20 MG tablet Take 20 mg by mouth at bedtime.   FLUoxetine (PROZAC) 20 MG tablet Take 1 tablet (20 mg total) by mouth daily.   ibuprofen (ADVIL) 200 MG tablet Take 400 mg by mouth every 6 (six) hours as needed.   naproxen sodium (ALEVE) 220 MG tablet Take 220 mg by mouth daily as needed.   rOPINIRole (REQUIP) 1 MG tablet Take 1 tablet (1 mg total) by mouth daily.   No facility-administered encounter medications on file as of 12/16/2020.    Current Diagnosis: Patient Active Problem List   Diagnosis Date Noted   Obstructive sleep apnea 02/26/2019   COPD mixed type (HCC) 02/26/2019    Goals Addressed   None    Reviewed chart for medication changes ahead of medication coordination call.  No OVs, Consults, or hospital visits since last care coordination call/Pharmacist visit.  No medication changes indicated  BP Readings from Last 3 Encounters:  08/16/20 118/74  01/13/20 123/85  06/12/19 120/80    No results found for: HGBA1C   Patient obtains medications through Adherence Packaging  30 Days   Last adherence delivery  included:     Bumetanide 1 MG Tablet Daily Breakfast             Calcium 600 + Vitamin D Twice Daily- Breakfast, Dinner.             Ropinirole 1 MG Tablet Daily- bedtime              Fluoxetine 20 MG Tablet Daily -bedtime             Aspirin 81 MG Tablet Daily  Patient declined medication  last month:  Albuterol-Patient states she needs a doctor appointment first.  Patient is due for next adherence delivery on: 12/23/2020. Called patient and reviewed medications and coordinated delivery.  This delivery to include:  Bumetanide 1 MG Tablet Daily Breakfast             Calcium 600 + Vitamin D Twice Daily- Breakfast, Dinner.             Ropinirole 1 MG Tablet Daily- bedtime              Fluoxetine 20 MG Tablet Daily -bedtime             Aspirin 81 MG Tablet Daily  Patient will not need a short fill of medication, prior to adherence delivery.  Patient will not need a  acute fill of medication, prior to adherence delivery.  Patient declined the following medications:  Albuterol-Patient states she needs  a doctor appointment first.  Patient needs refills for None ID .  Confirmed delivery date of 12/23/2020, advised patient that pharmacy will contact them the morning of delivery.  Follow-Up:  Pharmacist Review   Everlean Cherry Clinical Pharmacist Assistant [phone]   Vitamin D3 discontinued, Calcium 600 mg + Vit D 200 units twice daily added 8 minutes spent in review, coordination, and documentation. Garey Ham Clinical Pharmacist [facility]  [phone]

## 2020-12-17 DIAGNOSIS — J449 Chronic obstructive pulmonary disease, unspecified: Secondary | ICD-10-CM | POA: Diagnosis not present

## 2020-12-19 NOTE — Addendum Note (Signed)
Addended by: Julious Payer A on: 12/19/2020 08:54 AM   Modules accepted: Orders

## 2020-12-20 ENCOUNTER — Telehealth: Payer: Self-pay | Admitting: Family Medicine

## 2020-12-20 NOTE — Telephone Encounter (Signed)
Left message for patient to schedule Annual Wellness Visit.  Please schedule with Nurse Health Advisor Martha Stanley, RN at [facility] [place]  °

## 2021-01-16 ENCOUNTER — Telehealth: Payer: Self-pay

## 2021-01-16 NOTE — Progress Notes (Signed)
    Chronic Care Management Pharmacy Assistant   Name: Peggy Dixon  MRN: [id number] DOB: 1951-11-03  Reason for Encounter: Medication Review  Patient Questions:  1.  Have you seen any other providers since your last visit? No  2.  Any changes in your medicines or health? No     PCP : Overton Mam, DO  Allergies:  No Known Allergies  Medications: Outpatient Encounter Medications as of 01/16/2021  Medication Sig  . albuterol (VENTOLIN HFA) 108 (90 Base) MCG/ACT inhaler Inhale 2 puffs into the lungs every 6 (six) hours as needed for wheezing or shortness of breath. Pt needs appt prior to further refills.  Marland Kitchen aspirin EC 81 MG tablet Take 81 mg by mouth daily.  Marland Kitchen bismuth subsalicylate (PEPTO BISMOL) 262 MG/15ML suspension Take 30 mLs by mouth every 6 (six) hours as needed.  . bumetanide (BUMEX) 1 MG tablet Take 1 tablet (1 mg total) by mouth daily.  . Calcium Carbonate-Vitamin D (CALCIUM 600+D) 600-200 MG-UNIT TABS Take 1 tablet by mouth in the morning and at bedtime.  . famotidine (PEPCID) 20 MG tablet Take 20 mg by mouth at bedtime.  Marland Kitchen FLUoxetine (PROZAC) 20 MG tablet Take 1 tablet (20 mg total) by mouth daily.  Marland Kitchen ibuprofen (ADVIL) 200 MG tablet Take 400 mg by mouth every 6 (six) hours as needed.  . naproxen sodium (ALEVE) 220 MG tablet Take 220 mg by mouth daily as needed.  Marland Kitchen rOPINIRole (REQUIP) 1 MG tablet Take 1 tablet (1 mg total) by mouth daily.   No facility-administered encounter medications on file as of 01/16/2021.    Current Diagnosis: Patient Active Problem List   Diagnosis Date Noted  . Obstructive sleep apnea 02/26/2019  . COPD mixed type (HCC) 02/26/2019    Goals Addressed   None    Reviewed chart for medication changes ahead of medication coordination call.  No OVs, Consults, or hospital visits since last care coordination call/Pharmacist visit.  No medication changes indicated  BP Readings from Last 3 Encounters:  08/16/20 118/74  01/13/20 123/85   06/12/19 120/80    No results found for: HGBA1C   Patient obtains medications through Adherence Packaging  30 Days   Last adherence delivery included:     Bumetanide 1 MG Tablet Daily Breakfast Calcium 600 + Vitamin D Twice Daily- Breakfast, Dinner. Ropinirole 1 MG Tablet Daily- bedtime  Fluoxetine 20 MG Tablet Daily -bedtime Aspirin 81 MG Tablet Daily  Patient declined medication last month:     Albuterol-Patient states she needs a doctor appointment first.  Patient is due for next adherence delivery on: 01/23/2021. Called patient and reviewed medications and coordinated delivery.  This delivery to include:  Bumetanide 1 MG Tablet Daily Breakfast Calcium 600 + Vitamin D Twice Daily- Breakfast, Dinner. Ropinirole 1 MG Tablet Daily- bedtime  Fluoxetine 20 MG Tablet Daily -bedtime Aspirin 81 MG Tablet Daily  Patient will not  need a short fill of medication, prior to adherence delivery.  Patient will not need a  acute fill of medication, prior to adherence delivery.  Patient declined the following medications:  Albuterol-Patient states she needs a doctor appointment first.  Patient needs refills for None ID .  Confirmed delivery date of 01/23/2021, advised patient that pharmacy will contact them the morning of delivery.  Follow-Up:  Pharmacist Review   Everlean Cherry Clinical Pharmacist Assistant [phone]

## 2021-01-17 DIAGNOSIS — J449 Chronic obstructive pulmonary disease, unspecified: Secondary | ICD-10-CM | POA: Diagnosis not present

## 2021-02-10 ENCOUNTER — Telehealth: Payer: Self-pay

## 2021-02-10 ENCOUNTER — Other Ambulatory Visit: Payer: Self-pay | Admitting: Family Medicine

## 2021-02-10 DIAGNOSIS — F322 Major depressive disorder, single episode, severe without psychotic features: Secondary | ICD-10-CM

## 2021-02-10 DIAGNOSIS — F419 Anxiety disorder, unspecified: Secondary | ICD-10-CM

## 2021-02-10 MED ORDER — FLUOXETINE HCL 20 MG PO TABS
20.0000 mg | ORAL_TABLET | Freq: Every day | ORAL | 0 refills | Status: DC
Start: 2021-02-10 — End: 2021-05-26

## 2021-02-10 NOTE — Telephone Encounter (Signed)
[facility] is calling to get a refill on patients Fluoxetine. Please call them at [phone] if you have any questions.

## 2021-02-10 NOTE — Telephone Encounter (Signed)
Last VV 09/08/20 Last fill 09/06/20 #90/0

## 2021-02-10 NOTE — Progress Notes (Signed)
° ° °  Chronic Care Management Pharmacy Assistant   Name: Peggy Dixon  MRN: [id number] DOB: 05/21/1951  Reason for Encounter: Medication Review   Primary concerns for visit include: Dispensing medication call.   Recent office visits:  No recent office visit  Recent consult visits:  No recent consult visit  Hospital visits:  None in previous 6 months  Medications: Outpatient Encounter Medications as of 02/10/2021  Medication Sig   albuterol (VENTOLIN HFA) 108 (90 Base) MCG/ACT inhaler Inhale 2 puffs into the lungs every 6 (six) hours as needed for wheezing or shortness of breath. Pt needs appt prior to further refills.   aspirin EC 81 MG tablet Take 81 mg by mouth daily.   bismuth subsalicylate (PEPTO BISMOL) 262 MG/15ML suspension Take 30 mLs by mouth every 6 (six) hours as needed.   bumetanide (BUMEX) 1 MG tablet Take 1 tablet (1 mg total) by mouth daily.   Calcium Carbonate-Vitamin D 600-200 MG-UNIT TABS Take 1 tablet by mouth in the morning and at bedtime.   famotidine (PEPCID) 20 MG tablet Take 20 mg by mouth at bedtime.   FLUoxetine (PROZAC) 20 MG tablet Take 1 tablet (20 mg total) by mouth daily.   ibuprofen (ADVIL) 200 MG tablet Take 400 mg by mouth every 6 (six) hours as needed.   naproxen sodium (ALEVE) 220 MG tablet Take 220 mg by mouth daily as needed.   rOPINIRole (REQUIP) 1 MG tablet Take 1 tablet (1 mg total) by mouth daily.   No facility-administered encounter medications on file as of 02/10/2021.   Star Rating Drugs:None ID   Reviewed chart for medication changes ahead of medication coordination call.   BP Readings from Last 3 Encounters:  08/16/20 118/74  01/13/20 123/85  06/12/19 120/80    No results found for: HGBA1C   Patient obtains medications through Adherence Packaging  30 Days   Last adherence delivery included:   Bumetanide 1 MG one Tablet Daily Breakfast Calcium 600 + Vitamin D Twice Daily- Breakfast,  Dinner. Ropinirole 1 MG one Tablet Daily- bedtime  Fluoxetine 20 MG One Tablet Daily -bedtime Aspirin 81 MG One Tablet Daily- Breakfast  Patient declined medications last month:  Albuterol-Patient states she needs a doctor appointment first. Patient is due for next adherence delivery on: 02/20/2021. Called patient and reviewed medications and coordinated delivery.  This delivery to include:  Bumetanide 1 MG one Tablet Daily Breakfast Calcium 600 + Vitamin D Twice Daily- Breakfast, Dinner. Ropinirole 1 MG one Tablet Daily- bedtime  Fluoxetine 20 MG One Tablet Daily -bedtime Aspirin 81 MG One Tablet Daily- Breakfast   Patient will not need a short fill of medication , prior to adherence delivery.  Patient will not need a  acute fill of medication, prior to adherence delivery.  Patient declined the following medications:  Albuterol-Patient states she needs a doctor appointment first.  Patient needs refills for Fluoxetine 20 MG, reach out to PCP to request refill on 02/10/2021.Marland Kitchen  Confirmed delivery date of 02/20/2021, advised patient that pharmacy will contact them the morning of delivery.  Blood pressure readings: Patient states she does not check her blood  pressure at home because she never really had a problem with it.   Everlean Cherry Clinical Pharmacist Assistant [phone]

## 2021-02-10 NOTE — Telephone Encounter (Signed)
Medication request sent to Dr. Salena Saner.

## 2021-02-14 DIAGNOSIS — J449 Chronic obstructive pulmonary disease, unspecified: Secondary | ICD-10-CM | POA: Diagnosis not present

## 2021-03-08 ENCOUNTER — Telehealth: Payer: Self-pay | Admitting: *Deleted

## 2021-03-08 NOTE — Chronic Care Management (AMB) (Signed)
  Care Management   Note  03/08/2021 Name: Peggy Dixon MRN: [id number] DOB: 1951-03-21  Peggy Dixon is a 70 y.o. year old female who is a primary care patient of Overton Mam, DO and is actively engaged with the care management team. I reached out to [employer] by phone today to assist with re-scheduling a follow up visit with the Pharmacist  Follow up plan: Unsuccessful telephone outreach attempt made. A HIPAA compliant phone message was left for the patient providing contact information and requesting a return call.  The care management team will reach out to the patient again over the next 7 days.  If patient returns call to provider office, please advise to call Embedded Care Management Care Guide Peggy Dixon at [phone]    Peggy Dixon [facility] Guide, Embedded Care Coordination [facility]  Care Management

## 2021-03-10 ENCOUNTER — Telehealth: Payer: Self-pay | Admitting: Family Medicine

## 2021-03-10 NOTE — Chronic Care Management (AMB) (Signed)
  Care Management   Note  03/10/2021 Name: Peggy Dixon MRN: [id number] DOB: 1951/08/08  [name] is a 70 y.o. year old female who is a primary care patient of Overton Mam, DO and is actively engaged with the care management team. I reached out to Nadara Eaton by phone today to assist with re-scheduling a follow up visit with the Pharmacist  Follow up plan: Face to Face appointment with care management team member scheduled for:  03/13/2021  [facility] Guide, Embedded Care Coordination [facility]  Care Management

## 2021-03-10 NOTE — Telephone Encounter (Signed)
Good morning, I had to copy and paste mychart messages because it wouldn't let me forward it to you. If you could please take a look and see if pt needs to be scheduled

## 2021-03-10 NOTE — Telephone Encounter (Signed)
You  Musolf, Veverly Just now (9:16 AM)   AD   You're welcome, I will send him a message so he can look into it to see if you need to be scheduled or not. Have a great weekend!!   This MyChart message has not been read.   Tomia, Enlow  You 15 hours ago (5:30 PM)   KO   I don't know if I need it or not, I had no idea there was an appointment scheduled.  I think Trinna Post should be the one to ask what is happening with this, like who made the appointment and who canceled it. It's a head scratcher haha. Thanks, Cacie Gilbertson     You  Helseth, Jeananne 19 hours ago (2:09 PM)   AD   Good afternoon, It was with the pharmacist Trinna Post, Im not sure who canceled it but if you still need it I can have someone who works with him give you a call to reschedule.     Labrada, [employer], Generic 2 days ago   KO   I had no notification off an appointment on 03/13/2021. Who was this with and who canceled it? Thanks, Nadara Eaton     Mychart, Generic  Nadara Eaton 2 days ago   [employer]   Appointment Information:     Visit Type: Follow up visit with Care Management Staff in Office         Date: 03/13/2021                 Dept: [facility]                 Provider: [facility] CCM PHARMACIST                 Time: 2:30 PM                 Length: 60 min   Appt Status: Canceled      Cancel Reason: Provider       Mychart, Generic  Nadara Eaton 5 months ago   [employer]   Appointment Information:     Visit Type: Follow up visit with Care Management Staff in Office         Date: 03/13/2021                 Dept: [facility]                 Provider: [facility] CCM PHARMACIST                 Time: 2:30 PM                 Length: 60 min   Appt Status: Scheduled      This MyChart message has not been read.

## 2021-03-13 ENCOUNTER — Ambulatory Visit: Payer: Medicare Other

## 2021-03-13 NOTE — Progress Notes (Deleted)
Chronic Care Management Pharmacy Note  03/13/2021 Name:  Peggy Dixon MRN:  [id number] DOB:  1950/12/24  Subjective: Peggy Dixon is an 70 y.o. year old female who is a primary patient of Overton Mam, DO.  The CCM team was consulted for assistance with disease management and care coordination needs.    Engaged with patient face to face for follow up visit in response to provider referral for pharmacy case management and/or care coordination services.   Consent to Services:  The patient was given information about Chronic Care Management services, agreed to services, and gave verbal consent prior to initiation of services.  Please see initial visit note for detailed documentation.   Patient Care Team: Overton Mam, DO as PCP - General (Family Medicine) Gaspar Cola, [facility] as Pharmacist (Pharmacist)  Recent office visits: 9  Recent consult visits: [facility] visits: {Hospital DC Yes/No:25215}  Objective:  Lab Results  Component Value Date   CREATININE 0.61 06/17/2019   BUN 28 (H) 06/17/2019   GFR 97.82 06/17/2019   NA 141 06/17/2019   K 4.3 06/17/2019   CALCIUM 9.4 06/17/2019   CO2 29 06/17/2019   GLUCOSE 94 06/17/2019    Lab Results  Component Value Date/Time   GFR 97.82 06/17/2019 08:10 AM    Last diabetic Eye exam: No results found for: HMDIABEYEEXA  Last diabetic Foot exam: No results found for: HMDIABFOOTEX   Lab Results  Component Value Date   CHOL 199 06/17/2019   HDL 59.80 06/17/2019   LDLCALC 117 (H) 06/17/2019   TRIG 108.0 06/17/2019   CHOLHDL 3 06/17/2019    Hepatic Function Latest Ref Rng & Units 06/17/2019  AST 0 - 37 U/L 15  ALT 0 - 35 U/L 19    No results found for: TSH, FREET4  No flowsheet data found.  Lab Results  Component Value Date/Time   VD25OH 58.41 06/17/2019 08:10 AM    Clinical ASCVD: {YES/NO:21197} The ASCVD Risk score Denman George DC Jr., et al., 2013) failed to calculate for the following reasons:    Unable to determine if patient is Non-Hispanic African American    Depression screen [facility] 2/9 01/13/2020 12/23/2019 11/19/2019  Decreased Interest 0 0 3  Down, Depressed, Hopeless 1 1 3   PHQ - 2 Score 1 1 6   Altered sleeping - 3 3  Tired, decreased energy - 2 3  Change in appetite - 2 3  Feeling bad or failure about yourself  - 0 3  Trouble concentrating - 2 3  Moving slowly or fidgety/restless - 0 0  Suicidal thoughts - 0 0  PHQ-9 Score - 10 21  Difficult doing work/chores - - Very difficult     ***Other: (CHADS2VASc if Afib, MMRC or CAT for COPD, ACT, DEXA)  Social History   Tobacco Use  Smoking Status Former Smoker  . Packs/day: 0.50  . Types: Cigarettes  . Quit date: 12/04/1995  . Years since quitting: 25.2  Smokeless Tobacco Never Used   BP Readings from Last 3 Encounters:  08/16/20 118/74  01/13/20 123/85  06/12/19 120/80   Pulse Readings from Last 3 Encounters:  08/16/20 77  01/13/20 67  06/12/19 70   Wt Readings from Last 3 Encounters:  09/08/20 200 lb (90.7 kg)  08/16/20 198 lb 3.2 oz (89.9 kg)  11/19/19 189 lb (85.7 kg)   BMI Readings from Last 3 Encounters:  09/08/20 39.06 kg/m  08/16/20 38.71 kg/m  12/23/19 36.91 kg/m    Assessment/Interventions: Review of  patient past medical history, allergies, medications, health status, including review of consultants reports, laboratory and other test data, was performed as part of comprehensive evaluation and provision of chronic care management services.   SDOH:  (Social Determinants of Health) assessments and interventions performed: {yes/no:20286}  SDOH Screenings   Alcohol Screen: Not on file  Depression (LEX5-1): Not on file  Financial Resource Strain: Low Risk   . Difficulty of Paying Living Expenses: Not hard at all  Food Insecurity: Not on file  Housing: Not on file  Physical Activity: Not on file  Social Connections: Not on file  Stress: Not on file  Tobacco Use: Medium Risk  . Smoking Tobacco  Use: Former Smoker  . Smokeless Tobacco Use: Never Used  Transportation Needs: No Transportation Needs  . Lack of Transportation (Medical): No  . Lack of Transportation (Non-Medical): No    CCM Care Plan  No Known Allergies  Medications Reviewed Today    Reviewed by Gaspar Cola, [facility] (Pharmacist) on 01/16/21 at 1423  Med List Status: <None>  Medication Order Taking? Sig Documenting Provider Last Dose Status Informant  albuterol (VENTOLIN HFA) 108 (90 Base) MCG/ACT inhaler [id number]  Inhale 2 puffs into the lungs every 6 (six) hours as needed for wheezing or shortness of breath. Pt needs appt prior to further refills. Jetty Duhamel D, MD  Active   aspirin EC 81 MG tablet [id number] Yes Take 81 mg by mouth daily. [provider] Taking Active   bismuth subsalicylate (PEPTO BISMOL) 262 MG/15ML suspension [id number]  Take 30 mLs by mouth every 6 (six) hours as needed. [provider]  Active   bumetanide (BUMEX) 1 MG tablet [id number] Yes Take 1 tablet (1 mg total) by mouth daily. Overton Mam, DO Taking Active   Calcium Carbonate-Vitamin D 600-200 MG-UNIT TABS [id number] Yes Take 1 tablet by mouth in the morning and at bedtime. [provider] Taking Active   famotidine (PEPCID) 20 MG tablet [id number]  Take 20 mg by mouth at bedtime. [provider]  Active   FLUoxetine (PROZAC) 20 MG tablet [id number] Yes Take 1 tablet (20 mg total) by mouth daily. Overton Mam, DO Taking Active   ibuprofen (ADVIL) 200 MG tablet [id number]  Take 400 mg by mouth every 6 (six) hours as needed. [provider]  Active   naproxen sodium (ALEVE) 220 MG tablet [id number]  Take 220 mg by mouth daily as needed. [provider]  Active   rOPINIRole (REQUIP) 1 MG tablet [id number] Yes Take 1 tablet (1 mg total) by mouth daily. Overton Mam, DO Taking Active           Patient Active Problem List   Diagnosis Date Noted  . Obstructive sleep apnea  02/26/2019  . COPD mixed type (HCC) 02/26/2019    Immunization History  Administered Date(s) Administered  . Fluad Quad(high Dose 65+) 09/22/2019  . PFIZER(Purple Top)SARS-COV-2 Vaccination 01/10/2020, 02/03/2020  . Pneumococcal Conjugate-13 09/22/2019  . Tdap 12/03/2012    Conditions to be addressed/monitored:  COPD, Anxiety, Osteopenia and Restless Legs  There are no care plans that you recently modified to display for this patient.    Medication Assistance: {MEDASSISTANCEINFO:25044}  Patient's preferred pharmacy is:  [facility] - [place], [place] - [address] [place] [address] Phone: [phone] Fax: [phone]  Uses pill box? {Yes or If no, why not?:20788} Pt endorses ***% compliance  We discussed: {Pharmacy options:24294} Patient decided to: {  [facility]:73428}  Care Plan and Follow Up Patient Decision:  {FOLLOWUP:24991}  Plan: {CM FOLLOW UP PLAN:25073}  ***   Current Barriers:  . {pharmacybarriers:24917} . ***  Pharmacist Clinical Goal(s):  Marland Kitchen Patient will {PHARMACYGOALCHOICES:24921} through collaboration with PharmD and provider.  . ***  Interventions: . 1:1 collaboration with Overton Mam, DO regarding development and update of comprehensive plan of care as evidenced by provider attestation and co-signature . Inter-disciplinary care team collaboration (see longitudinal plan of care) . Comprehensive medication review performed; medication list updated in electronic medical record  COPD (Goal: control symptoms and prevent exacerbations) -{US controlled/uncontrolled:25276} -Current treatment  . Albuterol HFA 2 puff every 6 hours  -Medications previously tried: ***  -Gold Grade: {CHL HP [facility] COPD Gold JGOTL:[id number]} -Current COPD Classification:  {CHL HP [facility] COPD [phone]} -MMRC/CAT score: *** -Pulmonary function testing:  *** -Exacerbations requiring treatment in last 6 months: *** -Patient {Actions; denies-reports:120008} consistent use of maintenance inhaler -Frequency of rescue inhaler use: *** -Counseled on {CCMINHALERCOUNSELING:25121} -{CCMPHARMDINTERVENTION:25122}  Depression/Anxiety (Goal: ***) -{US controlled/uncontrolled:25276} -Current treatment: . *** -Medications previously tried/failed: *** -PHQ9: *** -GAD7: *** -Connected with *** for mental health support -Educated on {CCM mental health counseling:25127} -{CCMPHARMDINTERVENTION:25122}   Patient Goals/Self-Care Activities . Patient will:  - {pharmacypatientgoals:24919}  Follow Up Plan: {CM FOLLOW UP RCBU:38453}

## 2021-03-14 ENCOUNTER — Telehealth: Payer: Self-pay

## 2021-03-14 NOTE — Progress Notes (Signed)
    Chronic Care Management Pharmacy Assistant   Name: Peggy Dixon  MRN: [id number] DOB: 05-Sep-1951  Reason for Encounter: Medication Review/Medication Coordination Call.   Recent office visits:  No recent office visit  Recent consult visits:  No recent Consult [facility] visits:  None in previous 6 months  Medications: Outpatient Encounter Medications as of 03/14/2021  Medication Sig  . albuterol (VENTOLIN HFA) 108 (90 Base) MCG/ACT inhaler Inhale 2 puffs into the lungs every 6 (six) hours as needed for wheezing or shortness of breath. Pt needs appt prior to further refills.  Marland Kitchen aspirin EC 81 MG tablet Take 81 mg by mouth daily.  Marland Kitchen bismuth subsalicylate (PEPTO BISMOL) 262 MG/15ML suspension Take 30 mLs by mouth every 6 (six) hours as needed.  . bumetanide (BUMEX) 1 MG tablet Take 1 tablet (1 mg total) by mouth daily.  . Calcium Carbonate-Vitamin D 600-200 MG-UNIT TABS Take 1 tablet by mouth in the morning and at bedtime.  . famotidine (PEPCID) 20 MG tablet Take 20 mg by mouth at bedtime.  Marland Kitchen FLUoxetine (PROZAC) 20 MG tablet Take 1 tablet (20 mg total) by mouth daily.  Marland Kitchen ibuprofen (ADVIL) 200 MG tablet Take 400 mg by mouth every 6 (six) hours as needed.  . naproxen sodium (ALEVE) 220 MG tablet Take 220 mg by mouth daily as needed.  Marland Kitchen rOPINIRole (REQUIP) 1 MG tablet Take 1 tablet (1 mg total) by mouth daily.   No facility-administered encounter medications on file as of 03/14/2021.   Star Rating Drugs:N/A  Reviewed chart for medication changes ahead of medication coordination call.  BP Readings from Last 3 Encounters:  08/16/20 118/74  01/13/20 123/85  06/12/19 120/80    No results found for: HGBA1C   Patient obtains medications through Adherence Packaging  30 Days   Last adherence delivery included:   Bumetanide 1 MG one Tablet Daily Breakfast  Calcium 600 + Vitamin D Twice Daily- Breakfast, Dinner.  Ropinirole 1 MG one Tablet Daily- bedtime   Fluoxetine 20  MG One Tablet Daily -bedtime  Aspirin 81 MG One Tablet Daily- Breakfast   Patient declined medications last month:   Albuterol-Patient states she needs a doctor appointment first  Patient is due for next adherence delivery on: 03/21/2021. Called patient and reviewed medications and coordinated delivery.   This delivery to include:  Bumetanide 1 MG one Tablet Daily Breakfast  Calcium 600 + Vitamin D Twice Daily- Breakfast, Dinner.  Ropinirole 1 MG one Tablet Daily- bedtime   Fluoxetine 20 MG One Tablet Daily -bedtime  Aspirin 81 MG One Tablet Daily- Breakfast    Patient declined the following medications   Albuterol-Patient states she needs a doctor appointment first  Patient needs refills for :  Bumetanide 1 MG, reached out to PCP to request refill on 03/15/2021 .  Confirmed delivery date of 03/21/2021, advised patient that pharmacy will contact them the morning of delivery.  Everlean Cherry Clinical Pharmacist Assistant [phone]

## 2021-03-15 ENCOUNTER — Telehealth: Payer: Self-pay

## 2021-03-15 MED ORDER — BUMETANIDE 1 MG PO TABS: 1.0000 mg | ORAL_TABLET | Freq: Every day | ORAL | 0 refills | Status: AC

## 2021-03-15 NOTE — Telephone Encounter (Signed)
[facility] requesting a refill for  bumetanide (BUMEX) 1 MG tablet

## 2021-03-15 NOTE — Telephone Encounter (Signed)
Med sent to pharmacy today.

## 2021-03-15 NOTE — Telephone Encounter (Signed)
Last vv 09/08/20 Last fill 02/11/20  #90/3

## 2021-03-17 DIAGNOSIS — J449 Chronic obstructive pulmonary disease, unspecified: Secondary | ICD-10-CM | POA: Diagnosis not present

## 2021-03-31 ENCOUNTER — Encounter: Payer: Self-pay | Admitting: Adult Health

## 2021-03-31 ENCOUNTER — Other Ambulatory Visit: Payer: Self-pay

## 2021-03-31 ENCOUNTER — Ambulatory Visit (INDEPENDENT_AMBULATORY_CARE_PROVIDER_SITE_OTHER): Payer: Medicare Other

## 2021-03-31 ENCOUNTER — Ambulatory Visit: Payer: Medicare Other | Admitting: Adult Health

## 2021-03-31 VITALS — BP 138/82 | HR 74 | Ht 62.0 in | Wt 216.0 lb

## 2021-03-31 DIAGNOSIS — J449 Chronic obstructive pulmonary disease, unspecified: Secondary | ICD-10-CM | POA: Diagnosis not present

## 2021-03-31 DIAGNOSIS — G4733 Obstructive sleep apnea (adult) (pediatric): Secondary | ICD-10-CM

## 2021-03-31 MED ORDER — BREO ELLIPTA 100-25 MCG/INH IN AEPB: 1.0000 | INHALATION_SPRAY | Freq: Every day | RESPIRATORY_TRACT | 5 refills | Status: AC

## 2021-03-31 NOTE — Patient Instructions (Addendum)
Wear CPAP each night try to get in at least 4 or more hours. Work on healthy weight loss Do not drive if sleepy CPAP care Healthy sleep regimen Chest xray today  PFT  Begin BREO 1 puff daily , rinse after use.  Establish with Pulmonary when you move.  Please contact office for sooner follow up if symptoms do not improve or worsen or seek emergency care

## 2021-03-31 NOTE — Assessment & Plan Note (Signed)
Encouraged on CPAP compliance.  We will reach out to homecare company to check on download  Plan  Patient Instructions  Wear CPAP each night try to get in at least 4 or more hours. Work on healthy weight loss Do not drive if sleepy CPAP care Healthy sleep regimen Chest xray today  PFT  Begin BREO 1 puff daily , rinse after use.  Establish with Pulmonary when you move.  Please contact office for sooner follow up if symptoms do not improve or worsen or seek emergency care

## 2021-03-31 NOTE — Assessment & Plan Note (Signed)
Reported history of COPD and former smoker. Will restart Breo 1 puff daily Check PFTs  Plan  . Patient Instructions  Wear CPAP each night try to get in at least 4 or more hours. Work on healthy weight loss Do not drive if sleepy CPAP care Healthy sleep regimen Chest xray today  PFT  Begin BREO 1 puff daily , rinse after use.  Establish with Pulmonary when you move.  Please contact office for sooner follow up if symptoms do not improve or worsen or seek emergency care

## 2021-03-31 NOTE — Progress Notes (Signed)
@Patient  ID: , female    DOB: Mar 29, 1951, 70 y.o.   MRN: [id number]  Chief Complaint  Patient presents with  . Follow-up    Referring provider: [id number], DO  HPI: 70 year old female former smoker followed for severe obstructive sleep apnea   TEST/EVENTS :  NPSG 2018- [place] 06/26/17- AHI 108.1/ hr, desaturation to 82%, BMI 40.8  03/31/2021 Follow up : OSA  Patient returns for a follow-up visit.  Last seen June 2020.  Patient has underlying severe obstructive sleep apnea.  Previous sleep study and 2018 showed AHI at 108/hour.  Patient has been recommended to remain on CPAP at bedtime.  Patient says that she tries to wear it but misses several nights.  CPAP download shows 33% compliance.  Daily average usage around 5 hours.  Patient is on auto CPAP 5 to 15 cm H2O.  AHI 3.7.  Patient is encouraged on CPAP compliance.  Patient education on potential complications of untreated sleep apnea including cardiovascular risk factors.  Patient's weight has went up 33 pounds since last visit.  She is encouraged on healthy weight loss She says she is wearing her CPAP each night, does not go without it . Will reach out to DME to check on Download.   Patient says she has a diagnosis of COPD.  Last visit patient was recommended to return for pulmonary function testing but did not have this completed.  Her chest x-ray showed mild hyperinflation without acute findings.  She is a former smoker, quit in 1997. She has not on any maintenance inhalers.  Does have an albuterol inhaler that she can use as needed. Previously on BREO couple of years ago and wants to restart . Has mild dry cough but gets winded with heavy activity . No chest pain , orthopnea or edema.   Patient is moving to [place].   No Known Allergies  Immunization History  Administered Date(s) Administered  . Fluad Quad(high Dose 65+) 09/22/2019  . PFIZER(Purple Top)SARS-COV-2 Vaccination 01/10/2020,  02/03/2020  . Pneumococcal Conjugate-13 09/22/2019  . Tdap 12/03/2012    Past Medical History:  Diagnosis Date  . COPD (chronic obstructive pulmonary disease) (HCC)   . OSA (obstructive sleep apnea)     Tobacco History: Social History   Tobacco Use  Smoking Status Former Smoker  . Packs/day: 0.50  . Types: Cigarettes  . Quit date: 12/04/1995  . Years since quitting: 25.3  Smokeless Tobacco Never Used   Counseling given: Not Answered   Outpatient Medications Prior to Visit  Medication Sig Dispense Refill  . albuterol (VENTOLIN HFA) 108 (90 Base) MCG/ACT inhaler Inhale 2 puffs into the lungs every 6 (six) hours as needed for wheezing or shortness of breath. Pt needs appt prior to further refills. 8 g 0  . aspirin EC 81 MG tablet Take 81 mg by mouth daily.    02/01/1996 bismuth subsalicylate (PEPTO BISMOL) 262 MG/15ML suspension Take 30 mLs by mouth every 6 (six) hours as needed.    . bumetanide (BUMEX) 1 MG tablet Take 1 tablet (1 mg total) by mouth daily. 90 tablet 0  . Calcium Carbonate-Vitamin D 600-200 MG-UNIT TABS Take 1 tablet by mouth in the morning and at bedtime.    . famotidine (PEPCID) 20 MG tablet Take 20 mg by mouth at bedtime.    Marland Kitchen FLUoxetine (PROZAC) 20 MG tablet Take 1 tablet (20 mg total) by mouth daily. 90 tablet 0  . ibuprofen (ADVIL) 200 MG tablet Take 400 mg by  mouth every 6 (six) hours as needed.    . naproxen sodium (ALEVE) 220 MG tablet Take 220 mg by mouth daily as needed.    Marland Kitchen rOPINIRole (REQUIP) 1 MG tablet Take 1 tablet (1 mg total) by mouth daily. 90 tablet 3   No facility-administered medications prior to visit.     Review of Systems:   Constitutional:   No  weight loss, night sweats,  Fevers, chills,  +fatigue, or  lassitude.  HEENT:   No headaches,  Difficulty swallowing,  Tooth/dental problems, or  Sore throat,                No sneezing, itching, ear ache, nasal congestion, post nasal drip,   CV:  No chest pain,  Orthopnea, PND, swelling in lower  extremities, anasarca, dizziness, palpitations, syncope.   GI  No heartburn, indigestion, abdominal pain, nausea, vomiting, diarrhea, change in bowel habits, loss of appetite, bloody stools.   Resp:   No chest wall deformity  Skin: no rash or lesions.  GU: no dysuria, change in color of urine, no urgency or frequency.  No flank pain, no hematuria   MS:  No joint pain or swelling.  No decreased range of motion.  No back pain.    Physical Exam  BP 138/82   Pulse 74   Ht 5\' 2"  (1.575 m)   Wt 216 lb (98 kg)   SpO2 94%   BMI 39.51 kg/m   GEN: A/Ox3; pleasant , NAD, well nourished    HEENT:  [place]/AT,  , NOSE-clear, THROAT-clear, no lesions, no postnasal drip or exudate noted. Class 3 MP airway   NECK:  Supple w/ fair ROM; no JVD; normal carotid impulses w/o bruits; no thyromegaly or nodules palpated; no lymphadenopathy.    RESP  Clear  P & A; w/o, wheezes/ rales/ or rhonchi. no accessory muscle use, no dullness to percussion  CARD:  RRR, no m/r/g, no peripheral edema, pulses intact, no cyanosis or clubbing.  GI:   Soft & nt; nml bowel sounds; no organomegaly or masses detected.   Musco: Warm bil, no deformities or joint swelling noted.   Neuro: alert, no focal deficits noted.    Skin: Warm, no lesions or rashes    Lab Results:  CBC No results found for: WBC, RBC, HGB, HCT, PLT, MCV, MCH, MCHC, RDW, LYMPHSABS, MONOABS, EOSABS, BASOSABS  BMET   BNP No results found for: BNP  ProBNP No results found for: PROBNP  Imaging: No results found.    No flowsheet data found.  No results found for: NITRICOXIDE      Assessment & Plan:   Obstructive sleep apnea Encouraged on CPAP compliance.  We will reach out to homecare company to check on download  Plan  Patient Instructions  Wear CPAP each night try to get in at least 4 or more hours. Work on healthy weight loss Do not drive if sleepy CPAP care Healthy sleep regimen Chest xray today  PFT  Begin BREO 1  puff daily , rinse after use.  Establish with Pulmonary when you move.  Please contact office for sooner follow up if symptoms do not improve or worsen or seek emergency care        COPD mixed type [facility]) Reported history of COPD and former smoker. Will restart Breo 1 puff daily Check PFTs  Plan  . Patient Instructions  Wear CPAP each night try to get in at least 4 or more hours. Work on healthy weight loss  Do not drive if sleepy CPAP care Healthy sleep regimen Chest xray today  PFT  Begin BREO 1 puff daily , rinse after use.  Establish with Pulmonary when you move.  Please contact office for sooner follow up if symptoms do not improve or worsen or seek emergency care           Rubye Oaks, NP 03/31/2021

## 2021-04-03 NOTE — Progress Notes (Signed)
Called and spoke with patient, advised of results/recommendations per Rubye Oaks NP.  She verbalized understanding.  Scheduled patient for covid test and PFT prior to her move out of state.  Nothing further needed,.

## 2021-04-07 ENCOUNTER — Telehealth: Payer: Self-pay

## 2021-04-07 ENCOUNTER — Other Ambulatory Visit (HOSPITAL_COMMUNITY)
Admission: RE | Admit: 2021-04-07 | Discharge: 2021-04-07 | Disposition: A | Discharge status: Home / Self Care | Payer: Medicare Other | Source: Ambulatory Visit | Attending: Adult Health | Admitting: Adult Health

## 2021-04-07 DIAGNOSIS — Z01812 Encounter for preprocedural laboratory examination: Secondary | ICD-10-CM | POA: Diagnosis not present

## 2021-04-07 DIAGNOSIS — Z20822 Contact with and (suspected) exposure to covid-19: Secondary | ICD-10-CM | POA: Insufficient documentation

## 2021-04-07 NOTE — Progress Notes (Signed)
    Chronic Care Management Pharmacy Assistant   Name: Breonna Gafford  MRN: [id number] DOB: 12/24/1950  Reason for Encounter: Medication Review/Medication Coordination Call.   Recent office visits:  No recent Office Visit  Recent consult visits:  03/31/2021 Rubye Oaks NP (Pulmonology) -start Breo 1 puff daily  Hospital visits:  None in previous 6 months  Medications: Outpatient Encounter Medications as of 04/07/2021  Medication Sig  . albuterol (VENTOLIN HFA) 108 (90 Base) MCG/ACT inhaler Inhale 2 puffs into the lungs every 6 (six) hours as needed for wheezing or shortness of breath. Pt needs appt prior to further refills.  Marland Kitchen aspirin EC 81 MG tablet Take 81 mg by mouth daily.  Marland Kitchen bismuth subsalicylate (PEPTO BISMOL) 262 MG/15ML suspension Take 30 mLs by mouth every 6 (six) hours as needed.  . bumetanide (BUMEX) 1 MG tablet Take 1 tablet (1 mg total) by mouth daily.  . Calcium Carbonate-Vitamin D 600-200 MG-UNIT TABS Take 1 tablet by mouth in the morning and at bedtime.  . famotidine (PEPCID) 20 MG tablet Take 20 mg by mouth at bedtime.  Marland Kitchen FLUoxetine (PROZAC) 20 MG tablet Take 1 tablet (20 mg total) by mouth daily.  . fluticasone furoate-vilanterol (BREO ELLIPTA) 100-25 MCG/INH AEPB Inhale 1 puff into the lungs daily.  Marland Kitchen ibuprofen (ADVIL) 200 MG tablet Take 400 mg by mouth every 6 (six) hours as needed.  . naproxen sodium (ALEVE) 220 MG tablet Take 220 mg by mouth daily as needed.  Marland Kitchen rOPINIRole (REQUIP) 1 MG tablet Take 1 tablet (1 mg total) by mouth daily.   No facility-administered encounter medications on file as of 04/07/2021.   Star Rating Drugs: N/A  Reviewed chart for medication changes ahead of medication coordination call.  BP Readings from Last 3 Encounters:  03/31/21 138/82  08/16/20 118/74  01/13/20 123/85    No results found for: HGBA1C   Patient obtains medications through Adherence Packaging  30 Days   Last adherence delivery included:   Bumetanide 1  MGoneTablet Daily Breakfast  Calcium 600 + Vitamin D Twice Daily- Breakfast, Dinner.  Ropinirole 1 MGoneTablet Daily- bedtime   Fluoxetine 20 MGOneTablet Daily -bedtime  Aspirin 81 MGOneTablet Daily- Breakfast   Patient declined medications last month   Albuterol-Patient states she needs a doctor appointment first  Patient is due for next adherence delivery on: 04/18/2021. Called patient and reviewed medications and coordinated delivery.  This delivery to include:  Bumetanide 1 MGoneTablet Daily Breakfast  Calcium 600 + Vitamin D Twice Daily- Breakfast, Dinner.  Ropinirole 1 MGoneTablet Daily- bedtime   Fluoxetine 20 MGOneTablet Daily -bedtime  Aspirin 81 MGOneTablet Daily- Breakfast    Patient declined the following medications: Breo 100-25 mCG - Patient receive 30 day supply om 03/31/2021 from [employer].   Patient needs refills for None ID.  Confirmed delivery date of 04/18/2021, advised patient that pharmacy will contact them the morning of delivery.  Patient states she will be moving to a different state at the end of the month.Notifed Clinical Pharmacist of this change.  Everlean Cherry Clinical Pharmacist Assistant [phone]

## 2021-04-08 LAB — SARS CORONAVIRUS 2 (TAT 6-24 HRS): SARS Coronavirus 2: NEGATIVE

## 2021-04-10 ENCOUNTER — Other Ambulatory Visit: Payer: Self-pay

## 2021-04-10 ENCOUNTER — Ambulatory Visit (INDEPENDENT_AMBULATORY_CARE_PROVIDER_SITE_OTHER): Payer: Medicare Other | Admitting: Internal Medicine

## 2021-04-10 DIAGNOSIS — J449 Chronic obstructive pulmonary disease, unspecified: Secondary | ICD-10-CM | POA: Diagnosis not present

## 2021-04-10 DIAGNOSIS — G4733 Obstructive sleep apnea (adult) (pediatric): Secondary | ICD-10-CM

## 2021-04-10 LAB — PULMONARY FUNCTION TEST
DL/VA % pred: 116 %
DL/VA: 4.93 ml/min/mmHg/L
DLCO cor % pred: 86 %
DLCO cor: 15.79 ml/min/mmHg
DLCO unc % pred: 86 %
DLCO unc: 15.79 ml/min/mmHg
FEF 25-75 Post: 0.87 L/sec
FEF 25-75 Pre: 0.77 L/sec
FEF2575-%Change-Post: 12 %
FEF2575-%Pred-Post: 46 %
FEF2575-%Pred-Pre: 41 %
FEV1-%Change-Post: 1 %
FEV1-%Pred-Post: 62 %
FEV1-%Pred-Pre: 61 %
FEV1-Post: 1.35 L
FEV1-Pre: 1.32 L
FEV1FVC-%Change-Post: 5 %
FEV1FVC-%Pred-Pre: 90 %
FEV6-%Change-Post: -3 %
FEV6-%Pred-Post: 67 %
FEV6-%Pred-Pre: 70 %
FEV6-Post: 1.83 L
FEV6-Pre: 1.9 L
FEV6FVC-%Pred-Post: 104 %
FEV6FVC-%Pred-Pre: 104 %
FVC-%Change-Post: -3 %
FVC-%Pred-Post: 64 %
FVC-%Pred-Pre: 67 %
FVC-Post: 1.83 L
FVC-Pre: 1.9 L
Post FEV1/FVC ratio: 74 %
Post FEV6/FVC ratio: 100 %
Pre FEV1/FVC ratio: 69 %
Pre FEV6/FVC Ratio: 100 %
RV % pred: 109 %
RV: 2.25 L
TLC % pred: 92 %
TLC: 4.4 L

## 2021-04-10 NOTE — Patient Instructions (Signed)
Full PFT performed today. °

## 2021-04-10 NOTE — Progress Notes (Signed)
Full PFT performed Today 

## 2021-04-16 DIAGNOSIS — J449 Chronic obstructive pulmonary disease, unspecified: Secondary | ICD-10-CM | POA: Diagnosis not present

## 2021-04-24 NOTE — Progress Notes (Signed)
Subjective:   Peggy Dixon is a 70 y.o. female who presents for Medicare Annual (Subsequent) preventive examination.  I connected with Jamilett today by telephone and verified that I am speaking with the correct person using two identifiers. Location patient: home Location provider: work Persons participating in the virtual visit: patient, Engineer, civil (consulting).    I discussed the limitations, risks, security and privacy concerns of performing an evaluation and management service by telephone and the availability of in person appointments. I also discussed with the patient that there may be a patient responsible charge related to this service. The patient expressed understanding and verbally consented to this telephonic visit.    Interactive audio and video telecommunications were attempted between this provider and patient, however failed, due to patient having technical difficulties OR patient did not have access to video capability.  We continued and completed visit with audio only.  Some vital signs may be absent or patient reported.   Time Spent with patient on telephone encounter: 30 minutes  Review of Systems     Cardiac Risk Factors include: advanced age (>72men, >50 women);hypertension;obesity (BMI >30kg/m2)     Objective:    Today's Vitals   04/25/21 1030  BP: (!) 135/95  Weight: 211 lb 6.4 oz (95.9 kg)  Height: 5' (1.524 m)   Body mass index is 41.29 kg/m.  Advanced Directives 04/25/2021 01/13/2020  Does Patient Have a Medical Advance Directive? Yes Yes  Type of Estate agent of [place];Living will Healthcare Power of [place];Living will  Does patient want to make changes to medical advance directive? - No - Patient declined  Copy of Healthcare Power of Attorney in Chart? No - copy requested No - copy requested    Current Medications (verified) Outpatient Encounter Medications as of 04/25/2021  Medication Sig  . albuterol (VENTOLIN HFA) 108 (90 Base)  MCG/ACT inhaler Inhale 2 puffs into the lungs every 6 (six) hours as needed for wheezing or shortness of breath. Pt needs appt prior to further refills.  Marland Kitchen aspirin EC 81 MG tablet Take 81 mg by mouth daily.  Marland Kitchen bismuth subsalicylate (PEPTO BISMOL) 262 MG/15ML suspension Take 30 mLs by mouth every 6 (six) hours as needed.  . bumetanide (BUMEX) 1 MG tablet Take 1 tablet (1 mg total) by mouth daily.  . Calcium Carbonate-Vitamin D 600-200 MG-UNIT TABS Take 1 tablet by mouth in the morning and at bedtime.  . famotidine (PEPCID) 20 MG tablet Take 20 mg by mouth at bedtime.  Marland Kitchen FLUoxetine (PROZAC) 20 MG tablet Take 1 tablet (20 mg total) by mouth daily.  . fluticasone furoate-vilanterol (BREO ELLIPTA) 100-25 MCG/INH AEPB Inhale 1 puff into the lungs daily.  Marland Kitchen ibuprofen (ADVIL) 200 MG tablet Take 400 mg by mouth every 6 (six) hours as needed.  . naproxen sodium (ALEVE) 220 MG tablet Take 220 mg by mouth daily as needed.  Marland Kitchen rOPINIRole (REQUIP) 1 MG tablet Take 1 tablet (1 mg total) by mouth daily.   No facility-administered encounter medications on file as of 04/25/2021.    Allergies (verified) Patient has no known allergies.   History: Past Medical History:  Diagnosis Date  . COPD (chronic obstructive pulmonary disease) (HCC)   . OSA (obstructive sleep apnea)    Past Surgical History:  Procedure Laterality Date  . ABDOMINAL HYSTERECTOMY    . BREAST BIOPSY Left   . BREAST REDUCTION SURGERY    . eye surgery right     . REDUCTION MAMMAPLASTY    . right  knee replacement     Family History  Problem Relation Age of Onset  . Emphysema Mother   . Heart attack Mother   . Heart attack Father   . Multiple sclerosis Father    Social History   Socioeconomic History  . Marital status: Widowed    Spouse name: Not on file  . Number of children: Not on file  . Years of education: Not on file  . Highest education level: Not on file  Occupational History  . Occupation: retired  Tobacco Use  .  Smoking status: Former Smoker    Packs/day: 0.50    Types: Cigarettes    Quit date: 12/04/1995    Years since quitting: 25.4  . Smokeless tobacco: Never Used  Substance and Sexual Activity  . Alcohol use: Yes    Comment: couple drinks/month  . Drug use: Never  . Sexual activity: Not on file  Other Topics Concern  . Not on file  Social History Narrative  . Not on file   Social Determinants of Health   Financial Resource Strain: Low Risk   . Difficulty of Paying Living Expenses: Not hard at all  Food Insecurity: No Food Insecurity  . Worried About Programme researcher, broadcasting/film/video in the Last Year: Never true  . Ran Out of Food in the Last Year: Never true  Transportation Needs: No Transportation Needs  . Lack of Transportation (Medical): No  . Lack of Transportation (Non-Medical): No  Physical Activity: Inactive  . Days of Exercise per Week: 0 days  . Minutes of Exercise per Session: 0 min  Stress: No Stress Concern Present  . Feeling of Stress : Not at all  Social Connections: Socially Isolated  . Frequency of Communication with Friends and Family: More than three times a week  . Frequency of Social Gatherings with Friends and Family: More than three times a week  . Attends Religious Services: Never  . Active Member of Clubs or Organizations: No  . Attends Banker Meetings: Never  . Marital Status: Widowed    Tobacco Counseling Counseling given: Not Answered   Clinical Intake:  Pre-visit preparation completed: No  Pain : No/denies pain     Nutritional Status: BMI > 30  Obese Nutritional Risks: None Diabetes: No  How often do you need to have someone help you when you read instructions, pamphlets, or other written materials from your doctor or pharmacy?: 1 - Never  Diabetic?No  Interpreter Needed?: No  Information entered by :: Thomasenia Sales LPN   Activities of Daily Living In your present state of health, do you have any difficulty performing the  following activities: 04/25/2021  Hearing? N  Vision? N  Difficulty concentrating or making decisions? N  Walking or climbing stairs? N  Dressing or bathing? N  Doing errands, shopping? N  Preparing Food and eating ? N  Using the Toilet? N  In the past six months, have you accidently leaked urine? N  Do you have problems with loss of bowel control? N  Managing your Medications? N  Managing your Finances? N  Housekeeping or managing your Housekeeping? N  Some recent data might be hidden    Patient Care Team: Overton Mam, DO as PCP - General (Family Medicine) Gaspar Cola, [facility] as Pharmacist (Pharmacist)  Indicate any recent Medical Services you may have received from other than [facility] providers in the past year (date may be approximate).     Assessment:   This is a  routine wellness examination [facility].  Hearing/Vision screen  Hearing Screening   125Hz  250Hz  500Hz  1000Hz  2000Hz  3000Hz  4000Hz  6000Hz  8000Hz   Right ear:           Left ear:           Comments: Bilateral hearing aids  Vision Screening Comments: Wears glasses Last eye exam-2 years ago  Dietary issues and exercise activities discussed: Current Exercise Habits: The patient does not participate in regular exercise at present, Exercise limited by: None identified  Goals Addressed            This Visit's Progress   . Patient Stated       Lose some weight by eating healthier      Depression Screen PHQ 2/9 Scores 04/25/2021 01/13/2020 12/23/2019 11/19/2019  PHQ - 2 Score 0 1 1 6   PHQ- 9 Score - - 10 21    Fall Risk Fall Risk  04/25/2021 01/13/2020 12/23/2019 11/19/2019 09/22/2019  Falls in the past year? 0 0 0 0 0  Number falls in past yr: 0 0 - - -  Injury with Fall? 0 0 - - -  Follow up Falls prevention discussed Education provided;Falls prevention discussed - - Falls evaluation completed    FALL RISK PREVENTION PERTAINING TO THE HOME:  Any stairs in or around the home? Yes  If so, are  there any without handrails? No  Home free of loose throw rugs in walkways, pet beds, electrical cords, etc? Yes  Adequate lighting in your home to reduce risk of falls? Yes   ASSISTIVE DEVICES UTILIZED TO PREVENT FALLS:  Life alert? No  Use of a cane, walker or w/c? No  Grab bars in the bathroom? No  Shower chair or bench in shower? No  Elevated toilet seat or a handicapped toilet? No   TIMED UP AND GO:  Was the test performed? No . Phone visit   Cognitive Function:Normal cognitive status assessed by  this Nurse Health Advisor. No abnormalities found.          Immunizations Immunization History  Administered Date(s) Administered  . Fluad Quad(high Dose 65+) 09/22/2019  . PFIZER(Purple Top)SARS-COV-2 Vaccination 01/10/2020, 02/03/2020  . Pneumococcal Conjugate-13 09/22/2019  . Tdap 12/03/2012    TDAP status: Up to date  Flu Vaccine status: Up to date  Pneumococcal vaccine status: Due, Education has been provided regarding the importance of this vaccine. Advised may receive this vaccine at local pharmacy or Health Dept. Aware to provide a copy of the vaccination record if obtained from local pharmacy or Health Dept. Verbalized acceptance and understanding.  Covid-19 vaccine status: Completed vaccines  Qualifies for Shingles Vaccine? Yes   Zostavax completed No   Shingrix Completed?: No.    Education has been provided regarding the importance of this vaccine. Patient has been advised to call insurance company to determine out of pocket expense if they have not yet received this vaccine. Advised may also receive vaccine at local pharmacy or Health Dept. Verbalized acceptance and understanding.  Screening Tests Health Maintenance  Topic Date Due  . Hepatitis C Screening  Never done  . COLONOSCOPY (Pts 45-5766yrs Insurance coverage will need to be confirmed)  Never done  . COVID-19 Vaccine (3 - Booster for Pfizer series) 07/05/2020  . PNA vac Low Risk Adult (2 of 2 -  PPSV23) 09/21/2020  . INFLUENZA VACCINE  07/03/2021  . MAMMOGRAM  11/22/2021  . TETANUS/TDAP  12/03/2022  . DEXA SCAN  Completed  . HPV VACCINES  Aged Out    Health Maintenance  Health Maintenance Due  Topic Date Due  . Hepatitis C Screening  Never done  . COLONOSCOPY (Pts 45-69yrs Insurance coverage will need to be confirmed)  Never done  . COVID-19 Vaccine (3 - Booster for Pfizer series) 07/05/2020  . PNA vac Low Risk Adult (2 of 2 - PPSV23) 09/21/2020    Colorectal cancers screening: Due- Declined today. Patient states she will be moving out of state soon & will have new PCP order.  Mammogram status:Due- Declined today. Patient states she will be moving out of state soon & will have new PCP order.   Bone Density status: Completed 09/04/2019. Results reflect: Bone density results: OSTEOPENIA. Repeat every 2 years.  Lung Cancer Screening: (Low Dose CT Chest recommended if Age 83-80 years, 30 pack-year currently smoking OR have quit w/in 15years.) does not qualify.    Additional Screening:  Hepatitis C Screening: does qualify; phone visit-discuss with PCP  Vision Screening: Recommended annual ophthalmology exams for early detection of glaucoma and other disorders of the eye. Is the patient up to date with their annual eye exam?  No  Who is the provider or what is the name of the office in which the patient attends annual eye exams? unsure If pt is not established with a provider, would they like to be referred to a provider to establish care? No .   Dental Screening: Recommended annual dental exams for proper oral hygiene  Community Resource Referral / Chronic Care Management: CRR required this visit?  No   CCM required this visit?  No      Plan:     I have personally reviewed and noted the following in the patient's chart:   . Medical and social history . Use of alcohol, tobacco or illicit drugs  . Current medications and supplements including opioid  prescriptions.  . Functional ability and status . Nutritional status . Physical activity . Advanced directives . List of other physicians . Hospitalizations, surgeries, and ER visits in previous 12 months . Vitals . Screenings to include cognitive, depression, and falls . Referrals and appointments  In addition, I have reviewed and discussed with patient certain preventive protocols, quality metrics, and best practice recommendations. A written personalized care plan for preventive services as well as general preventive health recommendations were provided to patient.   Due to this being a telephonic visit, the after visit summary with patients personalized plan was offered to patient via mail or my-chart. Patient would like to access on my-chart.   Roanna Raider, LPN   [id number]  Nurse Health Advisor  Nurse Notes: Patient states her B/P has been mildly elevated recently. Advised patient to call the office to make an appt for a B/P check & to take her meter with her for comparison. Patient voiced understanding.

## 2021-04-25 ENCOUNTER — Ambulatory Visit (INDEPENDENT_AMBULATORY_CARE_PROVIDER_SITE_OTHER): Payer: Medicare Other

## 2021-04-25 VITALS — BP 135/95 | Ht 60.0 in | Wt 211.4 lb

## 2021-04-25 DIAGNOSIS — Z0000 Encounter for general adult medical examination without abnormal findings: Secondary | ICD-10-CM | POA: Diagnosis not present

## 2021-04-25 NOTE — Patient Instructions (Signed)
Peggy Dixon , Thank you for taking time to complete your Medicare Wellness Visit. I appreciate your ongoing commitment to your health goals. Please review the following plan we discussed and let me know if I can assist you in the future.   Screening recommendations/referrals: Colonoscopy: Due-Per our conversation , you will be moving soon & will discuss with your new PCP. Mammogram: Due-Per our conversation , you will be moving soon & will discuss with your new PCP. Bone Density: Completed 09/04/2019-Due 09/03/2021 Recommended yearly ophthalmology/optometry visit for glaucoma screening and checkup Recommended yearly dental visit for hygiene and checkup  Vaccinations: Influenza vaccine: Due 08/2021 Pneumococcal vaccine: Due-May obtain at our office or your local pharmacy. Tdap vaccine: Up to date-Due 12/03/2022 Shingles vaccine: Discuss with pharmacy   Covid-19:Up to date  Advanced directives: Documents complete  Conditions/risks identified: See problem list  Next appointment: Follow up in one year for your annual wellness visit    Preventive Care 65 Years and Older, Female Preventive care refers to lifestyle choices and visits with your health care provider that can promote health and wellness. What does preventive care include?  A yearly physical exam. This is also called an annual well check.  Dental exams once or twice a year.  Routine eye exams. Ask your health care provider how often you should have your eyes checked.  Personal lifestyle choices, including:  Daily care of your teeth and gums.  Regular physical activity.  Eating a healthy diet.  Avoiding tobacco and drug use.  Limiting alcohol use.  Practicing safe sex.  Taking low-dose aspirin every day.  Taking vitamin and mineral supplements as recommended by your health care provider. What happens during an annual well check? The services and screenings done by your health care provider during your annual well  check will depend on your age, overall health, lifestyle risk factors, and family history of disease. Counseling  Your health care provider may ask you questions about your:  Alcohol use.  Tobacco use.  Drug use.  Emotional well-being.  Home and relationship well-being.  Sexual activity.  Eating habits.  History of falls.  Memory and ability to understand (cognition).  Work and work Astronomer.  Reproductive health. Screening  You may have the following tests or measurements:  Height, weight, and BMI.  Blood pressure.  Lipid and cholesterol levels. These may be checked every 5 years, or more frequently if you are over 45 years old.  Skin check.  Lung cancer screening. You may have this screening every year starting at age 78 if you have a 30-pack-year history of smoking and currently smoke or have quit within the past 15 years.  Fecal occult blood test (FOBT) of the stool. You may have this test every year starting at age 57.  Flexible sigmoidoscopy or colonoscopy. You may have a sigmoidoscopy every 5 years or a colonoscopy every 10 years starting at age 21.  Hepatitis C blood test.  Hepatitis B blood test.  Sexually transmitted disease (STD) testing.  Diabetes screening. This is done by checking your blood sugar (glucose) after you have not eaten for a while (fasting). You may have this done every 1-3 years.  Bone density scan. This is done to screen for osteoporosis. You may have this done starting at age 22.  Mammogram. This may be done every 1-2 years. Talk to your health care provider about how often you should have regular mammograms. Talk with your health care provider about your test results, treatment options, and if  necessary, the need for more tests. Vaccines  Your health care provider may recommend certain vaccines, such as:  Influenza vaccine. This is recommended every year.  Tetanus, diphtheria, and acellular pertussis (Tdap, Td) vaccine. You  may need a Td booster every 10 years.  Zoster vaccine. You may need this after age 63.  Pneumococcal 13-valent conjugate (PCV13) vaccine. One dose is recommended after age 45.  Pneumococcal polysaccharide (PPSV23) vaccine. One dose is recommended after age 40. Talk to your health care provider about which screenings and vaccines you need and how often you need them. This information is not intended to replace advice given to you by your health care provider. Make sure you discuss any questions you have with your health care provider. Document Released: 12/16/2015 Document Revised: 08/08/2016 Document Reviewed: 09/20/2015 Elsevier Interactive Patient Education  2017 [place] Prevention in the Home Falls can cause injuries. They can happen to people of all ages. There are many things you can do to make your home safe and to help prevent falls. What can I do on the outside of my home?  Regularly fix the edges of walkways and driveways and fix any cracks.  Remove anything that might make you trip as you walk through a door, such as a raised step or threshold.  Trim any bushes or trees on the path to your home.  Use bright outdoor lighting.  Clear any walking paths of anything that might make someone trip, such as rocks or tools.  Regularly check to see if handrails are loose or broken. Make sure that both sides of any steps have handrails.  Any raised decks and porches should have guardrails on the edges.  Have any leaves, snow, or ice cleared regularly.  Use sand or salt on walking paths during winter.  Clean up any spills in your garage right away. This includes oil or grease spills. What can I do in the bathroom?  Use night lights.  Install grab bars by the toilet and in the tub and shower. Do not use towel bars as grab bars.  Use non-skid mats or decals in the tub or shower.  If you need to sit down in the shower, use a plastic, non-slip stool.  Keep the floor  dry. Clean up any water that spills on the floor as soon as it happens.  Remove soap buildup in the tub or shower regularly.  Attach bath mats securely with double-sided non-slip rug tape.  Do not have throw rugs and other things on the floor that can make you trip. What can I do in the bedroom?  Use night lights.  Make sure that you have a light by your bed that is easy to reach.  Do not use any sheets or blankets that are too big for your bed. They should not hang down onto the floor.  Have a firm chair that has side arms. You can use this for support while you get dressed.  Do not have throw rugs and other things on the floor that can make you trip. What can I do in the kitchen?  Clean up any spills right away.  Avoid walking on wet floors.  Keep items that you use a lot in easy-to-reach places.  If you need to reach something above you, use a strong step stool that has a grab bar.  Keep electrical cords out of the way.  Do not use floor polish or wax that makes floors slippery. If you must  use wax, use non-skid floor wax.  Do not have throw rugs and other things on the floor that can make you trip. What can I do with my stairs?  Do not leave any items on the stairs.  Make sure that there are handrails on both sides of the stairs and use them. Fix handrails that are broken or loose. Make sure that handrails are as long as the stairways.  Check any carpeting to make sure that it is firmly attached to the stairs. Fix any carpet that is loose or worn.  Avoid having throw rugs at the top or bottom of the stairs. If you do have throw rugs, attach them to the floor with carpet tape.  Make sure that you have a light switch at the top of the stairs and the bottom of the stairs. If you do not have them, ask someone to add them for you. What else can I do to help prevent falls?  Wear shoes that:  Do not have high heels.  Have rubber bottoms.  Are comfortable and fit you  well.  Are closed at the toe. Do not wear sandals.  If you use a stepladder:  Make sure that it is fully opened. Do not climb a closed stepladder.  Make sure that both sides of the stepladder are locked into place.  Ask someone to hold it for you, if possible.  Clearly mark and make sure that you can see:  Any grab bars or handrails.  First and last steps.  Where the edge of each step is.  Use tools that help you move around (mobility aids) if they are needed. These include:  Canes.  Walkers.  Scooters.  Crutches.  Turn on the lights when you go into a dark area. Replace any light bulbs as soon as they burn out.  Set up your furniture so you have a clear path. Avoid moving your furniture around.  If any of your floors are uneven, fix them.  If there are any pets around you, be aware of where they are.  Review your medicines with your doctor. Some medicines can make you feel dizzy. This can increase your chance of falling. Ask your doctor what other things that you can do to help prevent falls. This information is not intended to replace advice given to you by your health care provider. Make sure you discuss any questions you have with your health care provider. Document Released: 09/15/2009 Document Revised: 04/26/2016 Document Reviewed: 12/24/2014 Elsevier Interactive Patient Education  2017 [employer].

## 2021-04-26 ENCOUNTER — Ambulatory Visit: Payer: Medicare Other

## 2021-04-26 ENCOUNTER — Other Ambulatory Visit: Payer: Self-pay

## 2021-04-26 VITALS — BP 135/88

## 2021-04-26 DIAGNOSIS — R030 Elevated blood-pressure reading, without diagnosis of hypertension: Secondary | ICD-10-CM

## 2021-04-26 NOTE — Progress Notes (Signed)
Pt is here for a BP check in regards to elevated readings on at home device, okay was given by Thomasenia Sales, LPN. pt brought home device to compare the readings as advised by LPN. Reading was done and monitored by [place] CMA-CPT at 3:45 pm.  office reading: 132/84 ( left arm )    home device reading (while in office):  135/88 ( right arm )   pt states that when at home her device usually reads 140/90 or higher depending on the time of day. Pt states she has a written copy of her at home readings.   I have advised the pt to take pictures of her readings from home and upload them via MyChart so PCP can review them, compared to today's readings. Pt verbalized understanding.

## 2021-05-09 ENCOUNTER — Telehealth: Payer: Self-pay

## 2021-05-09 NOTE — Progress Notes (Signed)
    Chronic Care Management Pharmacy Assistant   Name: Peggy Dixon  MRN: [id number] DOB: [birth year]  Reason for Encounter: Medication Review/Medication Coordination Call.    Recent office visits:  04/25/2021 Thomasenia Sales LPN (PCP Office)   Recent consult visits:  No recent Consult Visit  Hospital visits:  None in previous 6 months  Medications: Outpatient Encounter Medications as of 05/09/2021  Medication Sig  . albuterol (VENTOLIN HFA) 108 (90 Base) MCG/ACT inhaler Inhale 2 puffs into the lungs every 6 (six) hours as needed for wheezing or shortness of breath. Pt needs appt prior to further refills.  Marland Kitchen aspirin EC 81 MG tablet Take 81 mg by mouth daily.  Marland Kitchen bismuth subsalicylate (PEPTO BISMOL) 262 MG/15ML suspension Take 30 mLs by mouth every 6 (six) hours as needed.  . bumetanide (BUMEX) 1 MG tablet Take 1 tablet (1 mg total) by mouth daily.  . Calcium Carbonate-Vitamin D 600-200 MG-UNIT TABS Take 1 tablet by mouth in the morning and at bedtime.  . famotidine (PEPCID) 20 MG tablet Take 20 mg by mouth at bedtime.  Marland Kitchen FLUoxetine (PROZAC) 20 MG tablet Take 1 tablet (20 mg total) by mouth daily.  . fluticasone furoate-vilanterol (BREO ELLIPTA) 100-25 MCG/INH AEPB Inhale 1 puff into the lungs daily.  Marland Kitchen ibuprofen (ADVIL) 200 MG tablet Take 400 mg by mouth every 6 (six) hours as needed.  . naproxen sodium (ALEVE) 220 MG tablet Take 220 mg by mouth daily as needed.  Marland Kitchen rOPINIRole (REQUIP) 1 MG tablet Take 1 tablet (1 mg total) by mouth daily.   No facility-administered encounter medications on file as of 05/09/2021.   Star Rating Drugs: None ID  Reviewed chart for medication changes ahead of medication coordination call.   BP Readings from Last 3 Encounters:  04/26/21 135/88  04/25/21 (!) 135/95  03/31/21 138/82    No results found for: HGBA1C   Patient obtains medications through Adherence Packaging  30 Days   Last adherence delivery included:   Bumetanide 1 MGoneTablet  Daily Breakfast  Calcium 600 + Vitamin D Twice Daily- Breakfast, Dinner.  Ropinirole 1 MGoneTablet Daily- bedtime   Fluoxetine 20 MGOneTablet Daily -bedtime  Aspirin 81 MGOneTablet Daily- Breakfast   Patient declined medicaiton last month   Breo 100-25 mCG - Patient receive 30 day supply om 03/31/2021 from [facility].   Patient is due for next adherence delivery on: 05/17/2021. Called patient and reviewed medications and coordinated delivery.  Patient states she has moved to [place] and requesting her medication transfer to [facility] [address] [place] [address].Informed Clinical Pharmacist and [facility] of the change.Sent Message to [facility] to ask for the transfer.  Everlean Cherry Clinical Pharmacist Assistant [phone]

## 2021-05-17 DIAGNOSIS — J449 Chronic obstructive pulmonary disease, unspecified: Secondary | ICD-10-CM | POA: Diagnosis not present

## 2021-05-24 ENCOUNTER — Encounter: Payer: Self-pay | Admitting: Family Medicine

## 2021-05-24 NOTE — Telephone Encounter (Signed)
Please see message and advise.  Thank you. ° °

## 2021-05-26 ENCOUNTER — Telehealth: Payer: Self-pay

## 2021-05-26 DIAGNOSIS — F419 Anxiety disorder, unspecified: Secondary | ICD-10-CM

## 2021-05-26 DIAGNOSIS — F322 Major depressive disorder, single episode, severe without psychotic features: Secondary | ICD-10-CM

## 2021-05-26 MED ORDER — FLUOXETINE HCL 20 MG PO TABS: 20.0000 mg | ORAL_TABLET | Freq: Every day | ORAL | 0 refills | Status: DC

## 2021-05-26 NOTE — Progress Notes (Signed)
Patient states "I picked-up my RX's yesterday and Fluoxetine  was changed to Sertraline and I don't know why, I really do not feel comfortable changing my RX without knowing why. Please can I stay with the fluoxetine". Patient states the pharmacy told her they change her medication without a provider orders.Notified Clinical Pharmacist.  Per clinical pharmacist, is patient needing a new refill for Fluoxetine?  Patient states she may need a refill.Patient states she is going to call pharmacy and check.  Informed patient per clinical pharmacist , patient should not stop Fluoxetine and start Sertraline.Patient should stay on Fluoxetine.  Patient states she needs a refill of Fluoxetine of 90 day supply to go to [facility] [address] [place] [address]. Number - [phone] to contact. Notified Clinical Pharmacist.  Per Clinical Pharmacist he will send a prescription over to [facility].  Everlean Cherry Clinical Pharmacist Assistant [phone]

## 2021-05-31 DIAGNOSIS — J209 Acute bronchitis, unspecified: Secondary | ICD-10-CM | POA: Diagnosis not present

## 2021-05-31 DIAGNOSIS — R0602 Shortness of breath: Secondary | ICD-10-CM | POA: Diagnosis not present

## 2021-08-18 ENCOUNTER — Other Ambulatory Visit: Payer: Self-pay

## 2021-08-18 DIAGNOSIS — F419 Anxiety disorder, unspecified: Secondary | ICD-10-CM

## 2021-08-18 DIAGNOSIS — F322 Major depressive disorder, single episode, severe without psychotic features: Secondary | ICD-10-CM

## 2021-08-18 NOTE — Telephone Encounter (Signed)
Refill request for: Fluoxetine HCL 20 mg LR 05/26/21, #90, 0 rf LOV 04/25/21 FOV  none scheduled.     Please review and advise.  Thanks.  Dm/cma

## 2021-08-20 MED ORDER — FLUOXETINE HCL 20 MG PO TABS: 20.0000 mg | ORAL_TABLET | Freq: Every day | ORAL | 0 refills | Status: AC
